# Patient Record
Sex: Female | Born: 1960 | Race: White | Hispanic: No | Marital: Married | Smoking: Former smoker
Health system: Southern US, Community
[De-identification: ages and names within clinical notes are randomized; demographics above are authoritative.]

## PROBLEM LIST (undated history)

## (undated) DIAGNOSIS — Z972 Presence of dental prosthetic device (complete) (partial): Secondary | ICD-10-CM

## (undated) DIAGNOSIS — J189 Pneumonia, unspecified organism: Secondary | ICD-10-CM

## (undated) DIAGNOSIS — Z973 Presence of spectacles and contact lenses: Secondary | ICD-10-CM

## (undated) DIAGNOSIS — R413 Other amnesia: Secondary | ICD-10-CM

## (undated) DIAGNOSIS — G43909 Migraine, unspecified, not intractable, without status migrainosus: Secondary | ICD-10-CM

## (undated) DIAGNOSIS — M1990 Unspecified osteoarthritis, unspecified site: Secondary | ICD-10-CM

## (undated) DIAGNOSIS — I609 Nontraumatic subarachnoid hemorrhage, unspecified: Secondary | ICD-10-CM

## (undated) DIAGNOSIS — F32A Depression, unspecified: Secondary | ICD-10-CM

## (undated) DIAGNOSIS — E669 Obesity, unspecified: Secondary | ICD-10-CM

## (undated) DIAGNOSIS — I10 Essential (primary) hypertension: Secondary | ICD-10-CM

## (undated) DIAGNOSIS — F329 Major depressive disorder, single episode, unspecified: Secondary | ICD-10-CM

## (undated) DIAGNOSIS — F419 Anxiety disorder, unspecified: Secondary | ICD-10-CM

## (undated) DIAGNOSIS — D649 Anemia, unspecified: Secondary | ICD-10-CM

## (undated) DIAGNOSIS — E559 Vitamin D deficiency, unspecified: Secondary | ICD-10-CM

## (undated) DIAGNOSIS — R42 Dizziness and giddiness: Secondary | ICD-10-CM

## (undated) DIAGNOSIS — E1169 Type 2 diabetes mellitus with other specified complication: Secondary | ICD-10-CM

## (undated) HISTORY — PX: HERNIA REPAIR: SHX51

## (undated) HISTORY — PX: BREAST BIOPSY: SHX20

## (undated) HISTORY — DX: Type 2 diabetes mellitus with other specified complication: E66.9

## (undated) HISTORY — DX: Nontraumatic subarachnoid hemorrhage, unspecified: I60.9

## (undated) HISTORY — DX: Type 2 diabetes mellitus with other specified complication: E11.69

## (undated) HISTORY — PX: DILATION AND CURETTAGE OF UTERUS: SHX78

## (undated) HISTORY — PX: COLONOSCOPY: SHX174

## (undated) HISTORY — DX: Depression, unspecified: F32.A

## (undated) HISTORY — PX: FRACTURE SURGERY: SHX138

## (undated) HISTORY — PX: TONSILLECTOMY: SUR1361

## (undated) HISTORY — DX: Major depressive disorder, single episode, unspecified: F32.9

---

## 1997-06-15 HISTORY — PX: ORIF TIBIA & FIBULA FRACTURES: SHX2131

## 1997-11-04 ENCOUNTER — Emergency Department (HOSPITAL_COMMUNITY): Admission: EM | Admit: 1997-11-04 | Discharge: 1997-11-04 | Payer: Self-pay | Admitting: Emergency Medicine

## 1998-04-02 ENCOUNTER — Inpatient Hospital Stay (HOSPITAL_COMMUNITY): Admission: RE | Admit: 1998-04-02 | Discharge: 1998-04-05 | Payer: Self-pay | Admitting: Orthopedic Surgery

## 1999-04-04 ENCOUNTER — Ambulatory Visit (HOSPITAL_COMMUNITY): Admission: RE | Admit: 1999-04-04 | Discharge: 1999-04-04 | Payer: Self-pay | Admitting: Orthopedic Surgery

## 2005-10-14 ENCOUNTER — Encounter: Payer: Self-pay | Admitting: Orthopedic Surgery

## 2007-06-27 ENCOUNTER — Emergency Department: Payer: Self-pay | Admitting: Emergency Medicine

## 2007-06-27 ENCOUNTER — Other Ambulatory Visit: Payer: Self-pay

## 2007-08-11 ENCOUNTER — Emergency Department: Payer: Self-pay | Admitting: Emergency Medicine

## 2008-07-04 ENCOUNTER — Emergency Department: Payer: Self-pay | Admitting: Emergency Medicine

## 2011-01-19 ENCOUNTER — Other Ambulatory Visit (HOSPITAL_COMMUNITY): Payer: Self-pay | Admitting: Obstetrics

## 2011-01-19 DIAGNOSIS — R58 Hemorrhage, not elsewhere classified: Secondary | ICD-10-CM

## 2011-01-19 DIAGNOSIS — Z1231 Encounter for screening mammogram for malignant neoplasm of breast: Secondary | ICD-10-CM

## 2011-01-27 ENCOUNTER — Other Ambulatory Visit (HOSPITAL_COMMUNITY): Payer: Self-pay | Admitting: Obstetrics

## 2011-01-27 ENCOUNTER — Ambulatory Visit (HOSPITAL_COMMUNITY): Payer: Medicaid Other

## 2011-01-27 ENCOUNTER — Ambulatory Visit (HOSPITAL_COMMUNITY): Payer: Self-pay

## 2011-01-27 DIAGNOSIS — R58 Hemorrhage, not elsewhere classified: Secondary | ICD-10-CM

## 2011-02-04 ENCOUNTER — Ambulatory Visit (HOSPITAL_COMMUNITY)
Admission: RE | Admit: 2011-02-04 | Discharge: 2011-02-04 | Disposition: A | Discharge status: Home / Self Care | Payer: Medicare Other | Source: Ambulatory Visit | Attending: Obstetrics | Admitting: Obstetrics

## 2011-02-04 DIAGNOSIS — R58 Hemorrhage, not elsewhere classified: Secondary | ICD-10-CM

## 2011-02-04 DIAGNOSIS — Z1231 Encounter for screening mammogram for malignant neoplasm of breast: Secondary | ICD-10-CM

## 2011-02-04 DIAGNOSIS — N920 Excessive and frequent menstruation with regular cycle: Secondary | ICD-10-CM | POA: Insufficient documentation

## 2011-02-06 ENCOUNTER — Other Ambulatory Visit: Payer: Self-pay | Admitting: Obstetrics

## 2011-02-06 DIAGNOSIS — R928 Other abnormal and inconclusive findings on diagnostic imaging of breast: Secondary | ICD-10-CM

## 2011-03-16 ENCOUNTER — Ambulatory Visit
Admission: RE | Admit: 2011-03-16 | Discharge: 2011-03-16 | Disposition: A | Discharge status: Home / Self Care | Payer: Medicaid Other | Source: Ambulatory Visit | Attending: Obstetrics | Admitting: Obstetrics

## 2011-03-16 ENCOUNTER — Ambulatory Visit
Admission: RE | Admit: 2011-03-16 | Discharge: 2011-03-16 | Disposition: A | Discharge status: Home / Self Care | Payer: Medicare Other | Source: Ambulatory Visit | Attending: Obstetrics | Admitting: Obstetrics

## 2011-03-16 DIAGNOSIS — R928 Other abnormal and inconclusive findings on diagnostic imaging of breast: Secondary | ICD-10-CM

## 2011-08-04 DIAGNOSIS — I10 Essential (primary) hypertension: Secondary | ICD-10-CM | POA: Diagnosis not present

## 2011-08-04 DIAGNOSIS — E669 Obesity, unspecified: Secondary | ICD-10-CM | POA: Diagnosis not present

## 2011-12-02 DIAGNOSIS — I10 Essential (primary) hypertension: Secondary | ICD-10-CM | POA: Diagnosis not present

## 2011-12-02 DIAGNOSIS — T148 Other injury of unspecified body region: Secondary | ICD-10-CM | POA: Diagnosis not present

## 2011-12-02 DIAGNOSIS — W57XXXA Bitten or stung by nonvenomous insect and other nonvenomous arthropods, initial encounter: Secondary | ICD-10-CM | POA: Diagnosis not present

## 2012-03-01 DIAGNOSIS — I10 Essential (primary) hypertension: Secondary | ICD-10-CM | POA: Diagnosis not present

## 2012-03-01 DIAGNOSIS — Z1239 Encounter for other screening for malignant neoplasm of breast: Secondary | ICD-10-CM | POA: Diagnosis not present

## 2012-03-01 DIAGNOSIS — G9050 Complex regional pain syndrome I, unspecified: Secondary | ICD-10-CM | POA: Diagnosis not present

## 2012-06-27 DIAGNOSIS — M1710 Unilateral primary osteoarthritis, unspecified knee: Secondary | ICD-10-CM | POA: Diagnosis not present

## 2012-06-27 DIAGNOSIS — M19079 Primary osteoarthritis, unspecified ankle and foot: Secondary | ICD-10-CM | POA: Diagnosis not present

## 2012-06-28 DIAGNOSIS — I10 Essential (primary) hypertension: Secondary | ICD-10-CM | POA: Diagnosis not present

## 2012-06-28 DIAGNOSIS — J069 Acute upper respiratory infection, unspecified: Secondary | ICD-10-CM | POA: Diagnosis not present

## 2012-06-28 DIAGNOSIS — Z13220 Encounter for screening for lipoid disorders: Secondary | ICD-10-CM | POA: Diagnosis not present

## 2012-06-28 DIAGNOSIS — Z6841 Body Mass Index (BMI) 40.0 and over, adult: Secondary | ICD-10-CM | POA: Diagnosis not present

## 2012-06-28 DIAGNOSIS — Z131 Encounter for screening for diabetes mellitus: Secondary | ICD-10-CM | POA: Diagnosis not present

## 2012-06-30 DIAGNOSIS — R05 Cough: Secondary | ICD-10-CM | POA: Diagnosis not present

## 2012-06-30 DIAGNOSIS — J069 Acute upper respiratory infection, unspecified: Secondary | ICD-10-CM | POA: Diagnosis not present

## 2012-06-30 DIAGNOSIS — R059 Cough, unspecified: Secondary | ICD-10-CM | POA: Diagnosis not present

## 2012-06-30 DIAGNOSIS — J209 Acute bronchitis, unspecified: Secondary | ICD-10-CM | POA: Diagnosis not present

## 2012-09-24 IMAGING — US US TRANSVAGINAL NON-OB
1 series · 14 of 25 positions shown · non-contrast
Comparison: None.

CLINICAL DATA: Perimenopausal.  Menorrhagia.

TRANSABDOMINAL AND TRANSVAGINAL ULTRASOUND OF PELVIS
TECHNIQUE: Both transabdominal and transvaginal ultrasound
examinations of the pelvis were performed. Transabdominal technique
was performed for global imaging of the pelvis including uterus,
ovaries, adnexal regions, and pelvic cul-de-sac.

[Series 1: us pelvis complete · 14 of 62 slices shown]
[im 1/62]
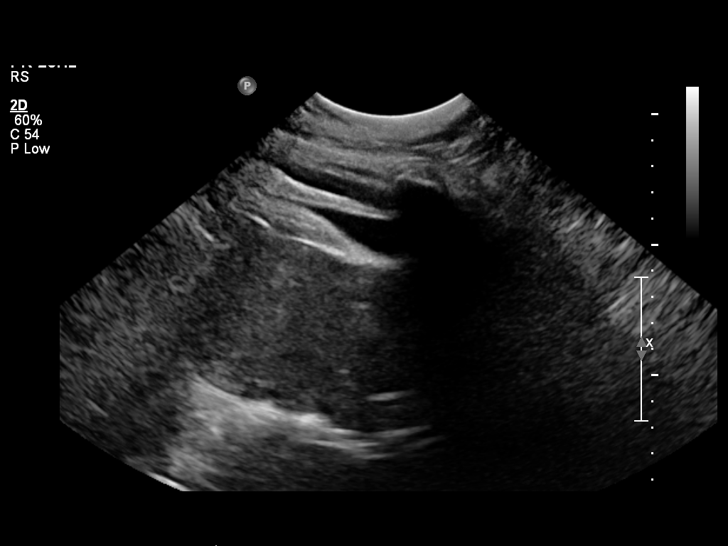
[im 6/62]
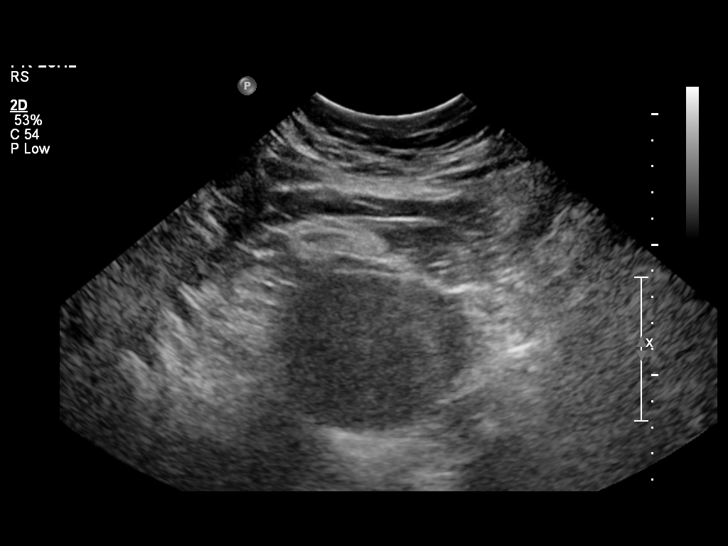
[im 11/62]
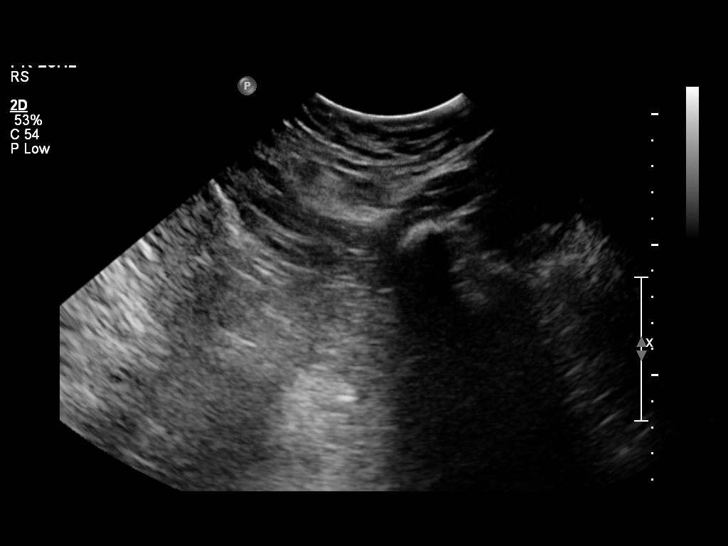
[im 16/62]
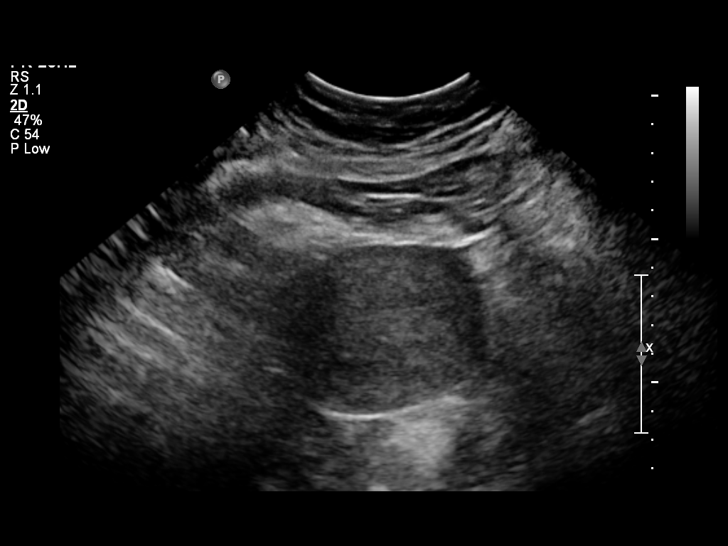
[im 21/62]
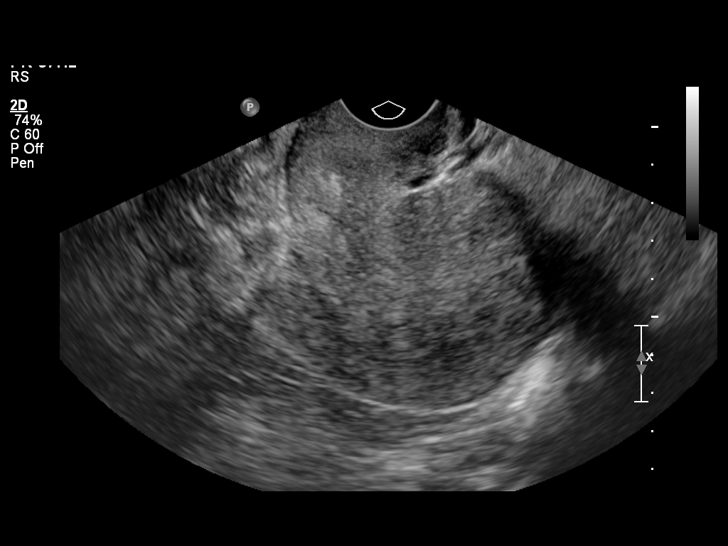
[im 23/62]
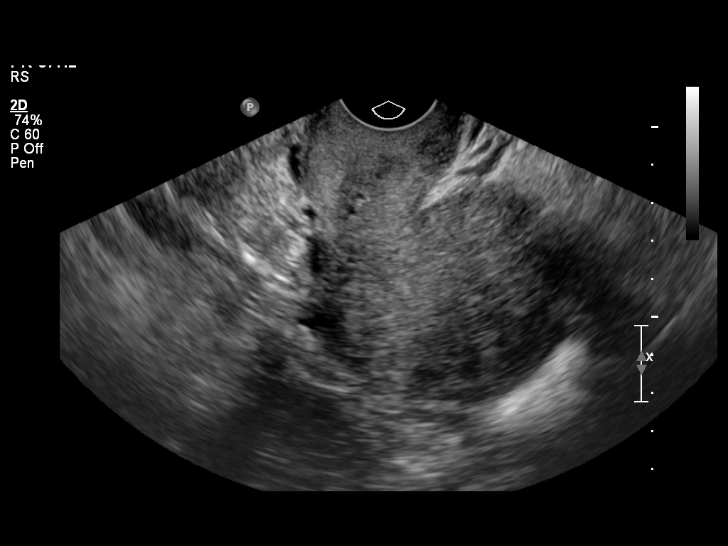
[im 28/62]
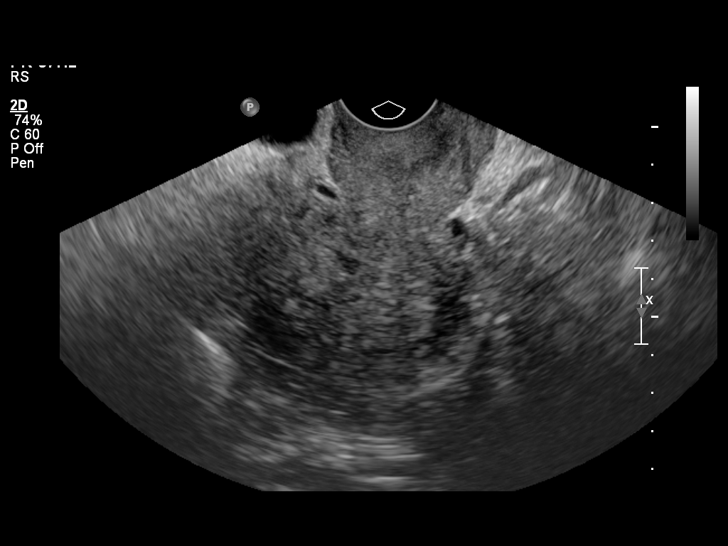
[im 34/62]
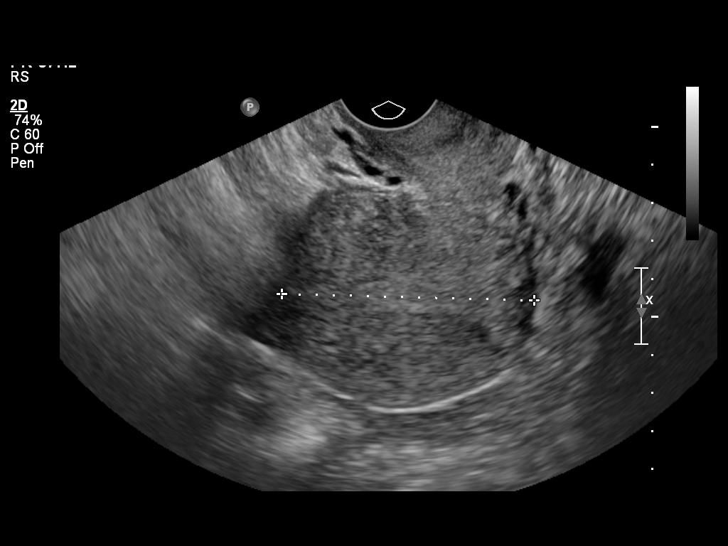
[im 39/62]
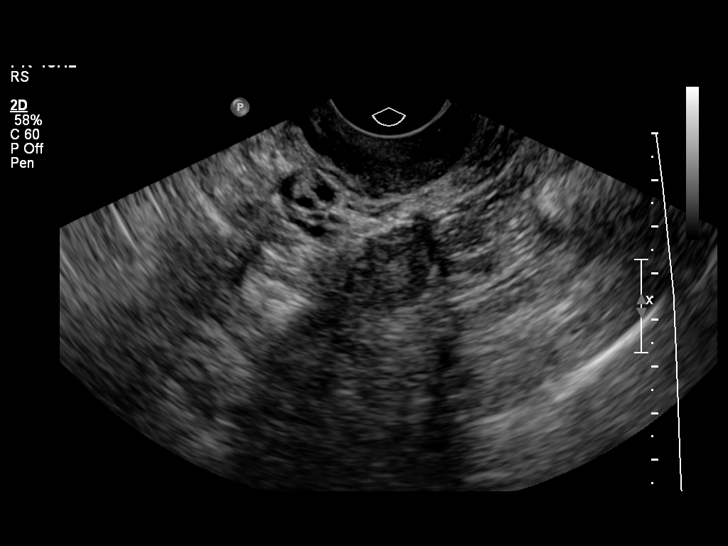
[im 41/62]
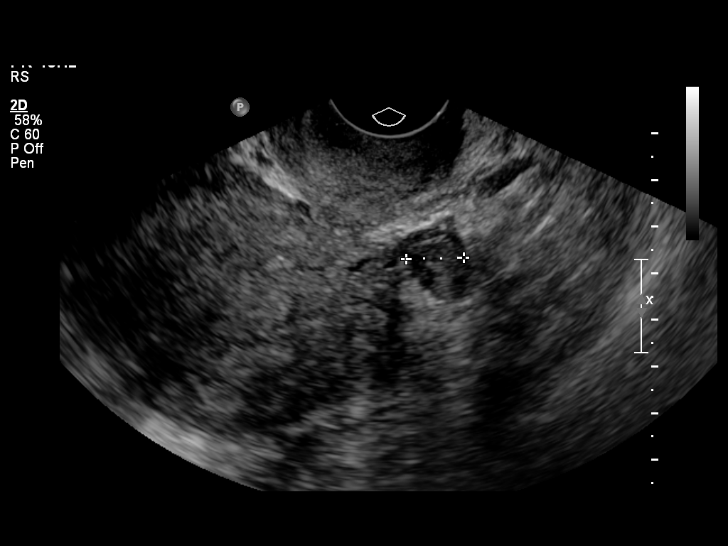
[im 46/62]
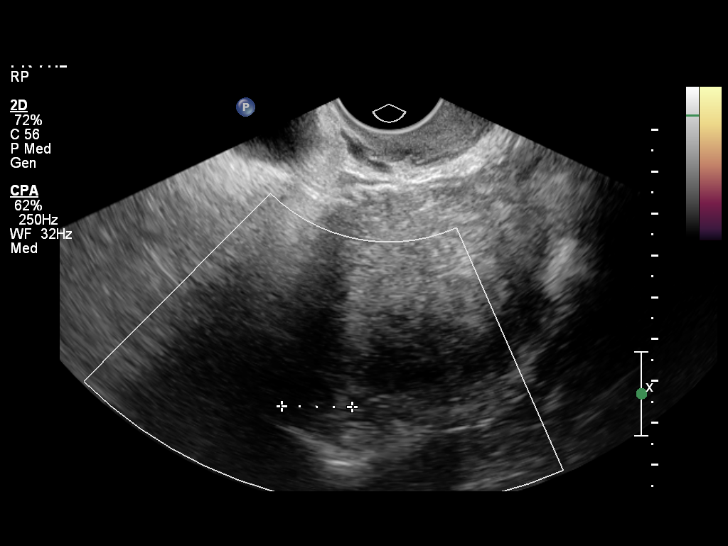
[im 51/62]
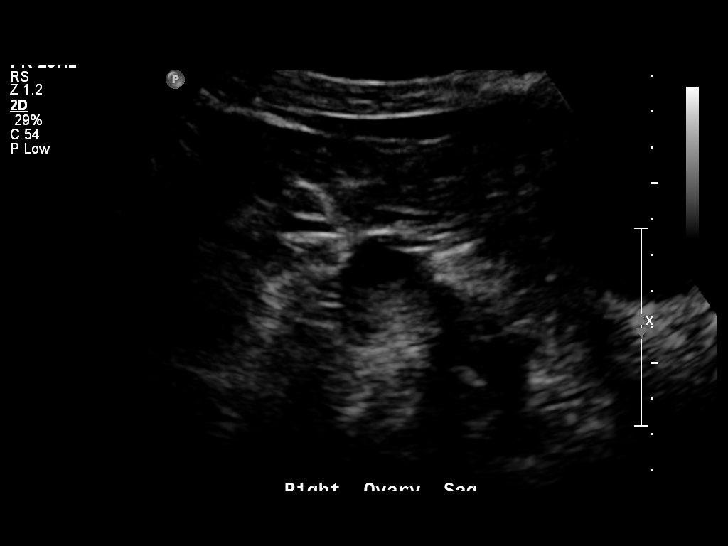
[im 56/62]
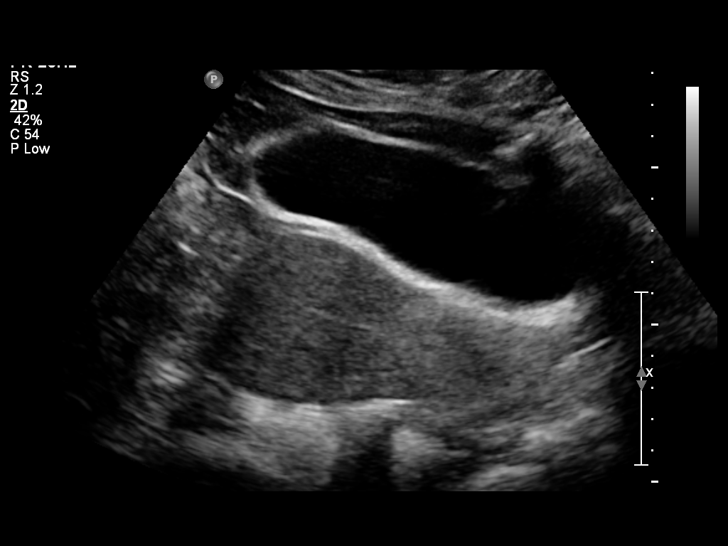
[im 62/62]
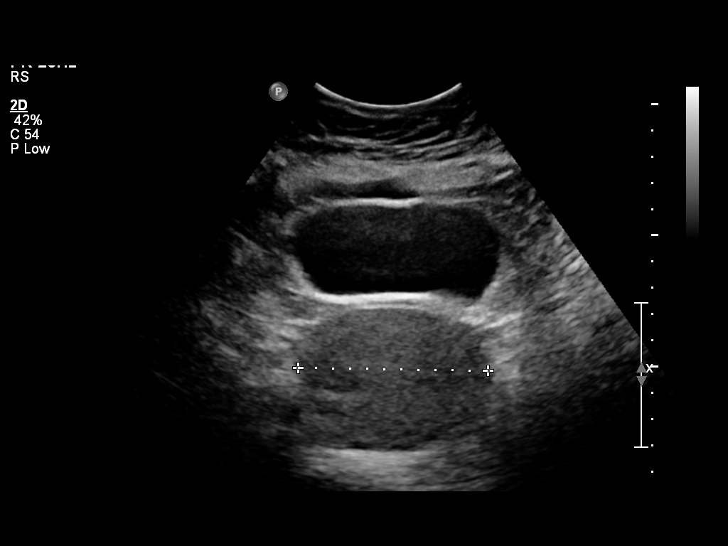

[14 of 25 positions shown; findings below may reference images not displayed]

It was necessary to proceed with endovaginal exam following the
transabdominal exam to visualize the ovaries, neither seen
transabdominally, and endometrium.
FINDINGS: Uterus: 10.8 x 7.7 x 5.4 cm.  Retroverted, retroflexed.  No focal
abnormality.

Endometrium: Not well visualized, 4 mm, thin and echogenic where
visualized.

Right ovary:  2.8 x 2.1 x 2.0 cm.  Normal appearance, when
transabdominal imaging was repeated after the initial
transabdominal /transvaginalstudy.

Left ovary: 2.4 x 1.5 x 1.2 cm.  Normal.

Other findings: No free fluid
IMPRESSION: No focal abnormality.  Endometrial atrophy measuring 4 mm, which
may present with vaginal bleeding, although not typically
menorrhagia.

## 2012-11-21 DIAGNOSIS — S90569A Insect bite (nonvenomous), unspecified ankle, initial encounter: Secondary | ICD-10-CM | POA: Diagnosis not present

## 2012-11-21 DIAGNOSIS — L02419 Cutaneous abscess of limb, unspecified: Secondary | ICD-10-CM | POA: Diagnosis not present

## 2012-12-27 DIAGNOSIS — Z1239 Encounter for other screening for malignant neoplasm of breast: Secondary | ICD-10-CM | POA: Diagnosis not present

## 2012-12-27 DIAGNOSIS — E559 Vitamin D deficiency, unspecified: Secondary | ICD-10-CM | POA: Diagnosis not present

## 2012-12-27 DIAGNOSIS — Z131 Encounter for screening for diabetes mellitus: Secondary | ICD-10-CM | POA: Diagnosis not present

## 2012-12-27 DIAGNOSIS — I10 Essential (primary) hypertension: Secondary | ICD-10-CM | POA: Diagnosis not present

## 2013-02-22 ENCOUNTER — Other Ambulatory Visit: Payer: Self-pay

## 2013-02-22 DIAGNOSIS — Z1231 Encounter for screening mammogram for malignant neoplasm of breast: Secondary | ICD-10-CM

## 2013-03-17 ENCOUNTER — Ambulatory Visit
Admission: RE | Admit: 2013-03-17 | Discharge: 2013-03-17 | Disposition: A | Discharge status: Home / Self Care | Payer: Medicare Other | Source: Ambulatory Visit

## 2013-03-17 DIAGNOSIS — Z1231 Encounter for screening mammogram for malignant neoplasm of breast: Secondary | ICD-10-CM

## 2013-03-27 ENCOUNTER — Other Ambulatory Visit: Payer: Self-pay | Admitting: Internal Medicine

## 2013-03-27 DIAGNOSIS — R928 Other abnormal and inconclusive findings on diagnostic imaging of breast: Secondary | ICD-10-CM

## 2013-04-07 ENCOUNTER — Other Ambulatory Visit: Payer: Self-pay | Admitting: Internal Medicine

## 2013-04-07 ENCOUNTER — Ambulatory Visit
Admission: RE | Admit: 2013-04-07 | Discharge: 2013-04-07 | Disposition: A | Discharge status: Home / Self Care | Payer: BC Managed Care – PPO | Source: Ambulatory Visit | Attending: Internal Medicine | Admitting: Internal Medicine

## 2013-04-07 ENCOUNTER — Ambulatory Visit
Admission: RE | Admit: 2013-04-07 | Discharge: 2013-04-07 | Disposition: A | Discharge status: Home / Self Care | Payer: Medicare Other | Source: Ambulatory Visit | Attending: Internal Medicine | Admitting: Internal Medicine

## 2013-04-07 DIAGNOSIS — R928 Other abnormal and inconclusive findings on diagnostic imaging of breast: Secondary | ICD-10-CM

## 2013-04-07 DIAGNOSIS — N6019 Diffuse cystic mastopathy of unspecified breast: Secondary | ICD-10-CM | POA: Diagnosis not present

## 2013-04-07 DIAGNOSIS — N6320 Unspecified lump in the left breast, unspecified quadrant: Secondary | ICD-10-CM

## 2013-04-07 DIAGNOSIS — N6489 Other specified disorders of breast: Secondary | ICD-10-CM | POA: Diagnosis not present

## 2013-06-27 DIAGNOSIS — E785 Hyperlipidemia, unspecified: Secondary | ICD-10-CM | POA: Diagnosis not present

## 2013-06-27 DIAGNOSIS — E669 Obesity, unspecified: Secondary | ICD-10-CM | POA: Diagnosis not present

## 2013-06-27 DIAGNOSIS — Z6841 Body Mass Index (BMI) 40.0 and over, adult: Secondary | ICD-10-CM | POA: Diagnosis not present

## 2013-06-27 DIAGNOSIS — Z23 Encounter for immunization: Secondary | ICD-10-CM | POA: Diagnosis not present

## 2013-06-27 DIAGNOSIS — E559 Vitamin D deficiency, unspecified: Secondary | ICD-10-CM | POA: Diagnosis not present

## 2013-06-27 DIAGNOSIS — I10 Essential (primary) hypertension: Secondary | ICD-10-CM | POA: Diagnosis not present

## 2013-06-27 DIAGNOSIS — R7309 Other abnormal glucose: Secondary | ICD-10-CM | POA: Diagnosis not present

## 2014-04-02 ENCOUNTER — Other Ambulatory Visit: Payer: Self-pay

## 2014-04-02 DIAGNOSIS — Z1231 Encounter for screening mammogram for malignant neoplasm of breast: Secondary | ICD-10-CM

## 2014-04-02 DIAGNOSIS — Z1239 Encounter for other screening for malignant neoplasm of breast: Secondary | ICD-10-CM

## 2014-04-18 ENCOUNTER — Ambulatory Visit
Admission: RE | Admit: 2014-04-18 | Discharge: 2014-04-18 | Disposition: A | Discharge status: Home / Self Care | Payer: Medicare Other | Source: Ambulatory Visit

## 2014-04-18 DIAGNOSIS — Z1231 Encounter for screening mammogram for malignant neoplasm of breast: Secondary | ICD-10-CM | POA: Diagnosis not present

## 2014-06-26 DIAGNOSIS — M129 Arthropathy, unspecified: Secondary | ICD-10-CM | POA: Diagnosis not present

## 2014-06-26 DIAGNOSIS — Z13220 Encounter for screening for lipoid disorders: Secondary | ICD-10-CM | POA: Diagnosis not present

## 2014-06-26 DIAGNOSIS — I10 Essential (primary) hypertension: Secondary | ICD-10-CM | POA: Diagnosis not present

## 2014-07-03 DIAGNOSIS — R221 Localized swelling, mass and lump, neck: Secondary | ICD-10-CM | POA: Diagnosis not present

## 2014-07-20 ENCOUNTER — Other Ambulatory Visit (INDEPENDENT_AMBULATORY_CARE_PROVIDER_SITE_OTHER): Payer: Self-pay | Admitting: Surgery

## 2014-07-20 DIAGNOSIS — R1900 Intra-abdominal and pelvic swelling, mass and lump, unspecified site: Secondary | ICD-10-CM | POA: Diagnosis not present

## 2014-07-30 ENCOUNTER — Encounter (HOSPITAL_BASED_OUTPATIENT_CLINIC_OR_DEPARTMENT_OTHER): Payer: Self-pay | Admitting: *Deleted

## 2014-07-30 NOTE — H&P (Signed)
Kristine Blackburn 07/20/2014 10:34 AM Location: [facility] Patient #: [id number] DOB: July 19, 1960 Married / Language: English / Race: Black or African American Female  History of Present Illness (Arista Kettlewell A. Ninfa Linden MD; 07/20/2014 11:00 AM) Patient words: New Patient.  The patient is a 54 year old female who presents with a complaint of Mass. She is referred to me by Dr. Lorra Hals for evaluation of a left flank mass. The patient reports that the mass has been present for at least 2 years. It is slowly increasing in size and causing increasing discomfort. She denies trauma to the area. She recently had an ultrasound suggesting this is a large lymph node. She denies night sweats. She is otherwise without complaints   Other Problems Sharman Crate, LPN; 0/06/7492 49:67 AM) Arthritis Depression High blood pressure  Past Surgical History Sharman Crate, LPN; 10/21/1636 46:65 AM) Breast Biopsy Left. Colon Polyp Removal - Colonoscopy Foot Surgery Left. Knee Surgery Left.  Diagnostic Studies History Sharman Crate, LPN; 02/21/3569 17:79 AM) Mammogram within last year  Allergies Sharman Crate, LPN; 08/21/298 92:33 AM) No Known Drug Allergies02/10/2014  Medication History Sharman Crate, LPN; 0/0/7622 63:33 AM) Lisinopril-Hydrochlorothiazide (20-12.5MG  Tablet, Oral) Active. Vitamin D (Ergocalciferol) (50000UNIT Capsule, Oral) Active.  Social History Sharman Crate, LPN; 10/17/5623 63:89 AM) Alcohol use Occasional alcohol use. Caffeine use Carbonated beverages. Illicit drug use Uses socially only. Tobacco use Former smoker.  Family History Sharman Crate, LPN; 08/20/3426 76:81 AM) Anesthetic complications Mother. Cervical Cancer Mother. Heart Disease Father. Heart disease in female family member before age 46 Heart disease in female family member before age 46 Hypertension Father, Mother. Melanoma Father, Mother. Migraine Headache Sister. Ovarian Cancer  Mother.  Pregnancy / Birth History Sharman Crate, LPN; 06/19/7260 03:55 AM) Age at menarche 27 years. Contraceptive History Oral contraceptives. Gravida 1 Irregular periods Maternal age 84-25 Para 0  Review of Systems Clarise Cruz [facility] LPN; 02/19/4162 84:53 AM) General Not Present- Appetite Loss, Chills, Fatigue, Fever, Night Sweats, Weight Gain and Weight Loss. Skin Not Present- Change in Wart/Mole, Dryness, Hives, Jaundice, New Lesions, Non-Healing Wounds, Rash and Ulcer. HEENT Present- Seasonal Allergies and Wears glasses/contact lenses. Not Present- Earache, Hearing Loss, Hoarseness, Nose Bleed, Oral Ulcers, Ringing in the Ears, Sinus Pain, Sore Throat, Visual Disturbances and Yellow Eyes. Respiratory Present- Snoring. Not Present- Bloody sputum, Chronic Cough, Difficulty Breathing and Wheezing. Breast Not Present- Breast Mass, Breast Pain, Nipple Discharge and Skin Changes. Cardiovascular Not Present- Chest Pain, Difficulty Breathing Lying Down, Leg Cramps, Palpitations, Rapid Heart Rate, Shortness of Breath and Swelling of Extremities. Gastrointestinal Not Present- Abdominal Pain, Bloating, Bloody Stool, Change in Bowel Habits, Chronic diarrhea, Constipation, Difficulty Swallowing, Excessive gas, Gets full quickly at meals, Hemorrhoids, Indigestion, Nausea, Rectal Pain and Vomiting. Female Genitourinary Not Present- Frequency, Nocturia, Painful Urination, Pelvic Pain and Urgency. Musculoskeletal Not Present- Back Pain, Joint Pain, Joint Stiffness, Muscle Pain, Muscle Weakness and Swelling of Extremities. Neurological Present- Weakness. Not Present- Decreased Memory, Fainting, Headaches, Numbness, Seizures, Tingling, Tremor and Trouble walking. Psychiatric Present- Change in Sleep Pattern and Depression. Not Present- Anxiety, Bipolar, Fearful and Frequent crying. Endocrine Not Present- Cold Intolerance, Excessive Hunger, Hair Changes, Heat Intolerance, Hot flashes and New  Diabetes. Hematology Not Present- Easy Bruising, Excessive bleeding, Gland problems, HIV and Persistent Infections.   Vitals Clarise Cruz [facility] LPN; 11/16/6801 21:22 AM) 07/20/2014 10:34 AM Weight: 271 lb Height: 63in Body Surface Area: 2.34 m Body Mass Index: 48.01 kg/m Temp.: 98.13F(Oral)  Pulse: 80 (Regular)  BP: 138/82 (Sitting, Right Arm, Standard)  Physical Exam (Lorne Winkels A. Ninfa Linden MD; 07/20/2014 11:01 AM) General Mental Status-Alert. General Appearance-Consistent with stated age. Hydration-Well hydrated. Voice-Normal.  Head and Neck Head-normocephalic, atraumatic with no lesions or palpable masses. Trachea-midline. Thyroid Gland Characteristics - normal size and consistency.  Eye Eyeball - Bilateral-Extraocular movements intact. Sclera/Conjunctiva - Bilateral-No scleral icterus.  Chest and Lung Exam Chest and lung exam reveals -quiet, even and easy respiratory effort with no use of accessory muscles and on auscultation, normal breath sounds, no adventitious sounds and normal vocal resonance. Inspection Chest Wall - Normal. Back - normal.  Breast Breast - Left-Symmetric, Non Tender, No Biopsy scars, no Dimpling, No Inflammation, No Lumpectomy scars, No Mastectomy scars, No Peau d' Orange. Breast - Right-Symmetric, Non Tender, No Biopsy scars, no Dimpling, No Inflammation, No Lumpectomy scars, No Mastectomy scars, No Peau d' Orange. Breast Lump-No Palpable Breast Mass.  Cardiovascular Cardiovascular examination reveals -normal heart sounds, regular rate and rhythm with no murmurs and normal pedal pulses bilaterally.  Abdomen Inspection Inspection of the abdomen reveals - No Hernias. Skin - Scar - no surgical scars. Palpation/Percussion Palpation and Percussion of the abdomen reveal - Soft, Non Tender, No Rebound tenderness, No Rigidity (guarding) and No hepatosplenomegaly. Auscultation Auscultation of the abdomen reveals - Bowel  sounds normal. Note: On the left flank, there is a 2-2-1/2 cm soft mass which is mobile and moderately deep but in the subcutaneous space tissue. There are no skin changes.   Neurologic Neurologic evaluation reveals -alert and oriented x 3 with no impairment of recent or remote memory. Mental Status-Normal.  Musculoskeletal Normal Exam - Left-Upper Extremity Strength Normal and Lower Extremity Strength Normal. Normal Exam - Right-Upper Extremity Strength Normal and Lower Extremity Strength Normal.  Lymphatic Head & Neck  General Head & Neck Lymphatics: Bilateral - Description - Normal. Axillary  General Axillary Region: Bilateral - Description - Normal. Tenderness - Non Tender. Femoral & Inguinal  Generalized Femoral & Inguinal Lymphatics: Bilateral - Description - Normal. Tenderness - Non Tender.    Assessment & Plan (Julianah Marciel A. 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 Nonetheless, it is enlarging and causing her discomfort so removal is recommended for histologic evaluation to rule out malignancy. I discussed this with her in detail. I discussed the risks of bleeding, infection, and recurrence. She understands and wishes to proceed with surgery

## 2014-07-30 NOTE — Progress Notes (Signed)
Pt states labs and ekg dr Lucile Shutters 3 weeks ago called and said needed these today

## 2014-07-31 ENCOUNTER — Encounter (HOSPITAL_BASED_OUTPATIENT_CLINIC_OR_DEPARTMENT_OTHER): Payer: Self-pay | Admitting: *Deleted

## 2014-07-31 ENCOUNTER — Ambulatory Visit (HOSPITAL_BASED_OUTPATIENT_CLINIC_OR_DEPARTMENT_OTHER): Payer: BLUE CROSS/BLUE SHIELD | Admitting: Anesthesiology

## 2014-07-31 ENCOUNTER — Encounter (HOSPITAL_BASED_OUTPATIENT_CLINIC_OR_DEPARTMENT_OTHER)
Admission: RE | Disposition: A | Discharge status: Home / Self Care | Payer: Self-pay | Source: Ambulatory Visit | Attending: Surgery

## 2014-07-31 ENCOUNTER — Ambulatory Visit (HOSPITAL_BASED_OUTPATIENT_CLINIC_OR_DEPARTMENT_OTHER)
Admission: RE | Admit: 2014-07-31 | Discharge: 2014-07-31 | Disposition: A | Discharge status: Home / Self Care | Payer: BLUE CROSS/BLUE SHIELD | Source: Ambulatory Visit | Attending: Surgery | Admitting: Surgery

## 2014-07-31 DIAGNOSIS — M1990 Unspecified osteoarthritis, unspecified site: Secondary | ICD-10-CM | POA: Insufficient documentation

## 2014-07-31 DIAGNOSIS — R222 Localized swelling, mass and lump, trunk: Secondary | ICD-10-CM | POA: Diagnosis present

## 2014-07-31 DIAGNOSIS — Z87891 Personal history of nicotine dependence: Secondary | ICD-10-CM | POA: Insufficient documentation

## 2014-07-31 DIAGNOSIS — F1910 Other psychoactive substance abuse, uncomplicated: Secondary | ICD-10-CM | POA: Diagnosis not present

## 2014-07-31 DIAGNOSIS — F329 Major depressive disorder, single episode, unspecified: Secondary | ICD-10-CM | POA: Insufficient documentation

## 2014-07-31 DIAGNOSIS — Z86010 Personal history of colonic polyps: Secondary | ICD-10-CM | POA: Insufficient documentation

## 2014-07-31 DIAGNOSIS — D1779 Benign lipomatous neoplasm of other sites: Secondary | ICD-10-CM | POA: Diagnosis not present

## 2014-07-31 DIAGNOSIS — D171 Benign lipomatous neoplasm of skin and subcutaneous tissue of trunk: Secondary | ICD-10-CM | POA: Insufficient documentation

## 2014-07-31 DIAGNOSIS — I10 Essential (primary) hypertension: Secondary | ICD-10-CM | POA: Insufficient documentation

## 2014-07-31 HISTORY — DX: Unspecified osteoarthritis, unspecified site: M19.90

## 2014-07-31 HISTORY — DX: Essential (primary) hypertension: I10

## 2014-07-31 HISTORY — DX: Presence of spectacles and contact lenses: Z97.3

## 2014-07-31 HISTORY — PX: MASS EXCISION: SHX2000

## 2014-07-31 SURGERY — EXCISION MASS
Anesthesia: Monitor Anesthesia Care | Site: Flank | Laterality: Left

## 2014-07-31 MED ORDER — SODIUM CHLORIDE 0.9 % IV SOLN: 250.0000 mL | INTRAVENOUS | Status: DC | PRN

## 2014-07-31 MED ORDER — MIDAZOLAM HCL 2 MG/2ML IJ SOLN
INTRAMUSCULAR | Status: AC
  Filled 2014-07-31: qty 2

## 2014-07-31 MED ORDER — BUPIVACAINE-EPINEPHRINE 0.5% -1:200000 IJ SOLN
INTRAMUSCULAR | Status: DC | PRN
  Administered 2014-07-31: 17 mL

## 2014-07-31 MED ORDER — HYDROCODONE-ACETAMINOPHEN 5-325 MG PO TABS: 1.0000 | ORAL_TABLET | ORAL | Status: DC | PRN

## 2014-07-31 MED ORDER — ACETAMINOPHEN 650 MG RE SUPP: 650.0000 mg | RECTAL | Status: DC | PRN

## 2014-07-31 MED ORDER — ONDANSETRON HCL 4 MG/2ML IJ SOLN
INTRAMUSCULAR | Status: DC | PRN
  Administered 2014-07-31: 4 mg via INTRAVENOUS

## 2014-07-31 MED ORDER — PROPOFOL INFUSION 10 MG/ML OPTIME
INTRAVENOUS | Status: DC | PRN
  Administered 2014-07-31: 100 ug/kg/min via INTRAVENOUS

## 2014-07-31 MED ORDER — SODIUM CHLORIDE 0.9 % IJ SOLN: 3.0000 mL | Freq: Two times a day (BID) | INTRAMUSCULAR | Status: DC

## 2014-07-31 MED ORDER — LACTATED RINGERS IV SOLN
INTRAVENOUS | Status: DC
  Administered 2014-07-31: 09:00:00 via INTRAVENOUS

## 2014-07-31 MED ORDER — CEFAZOLIN SODIUM-DEXTROSE 2-3 GM-% IV SOLR
2.0000 g | INTRAVENOUS | Status: AC
  Administered 2014-07-31: 2 g via INTRAVENOUS

## 2014-07-31 MED ORDER — OXYCODONE HCL 5 MG/5ML PO SOLN: 5.0000 mg | Freq: Once | ORAL | Status: DC | PRN

## 2014-07-31 MED ORDER — OXYCODONE HCL 5 MG PO TABS: 5.0000 mg | ORAL_TABLET | Freq: Once | ORAL | Status: DC | PRN

## 2014-07-31 MED ORDER — LIDOCAINE HCL (CARDIAC) 20 MG/ML IV SOLN
INTRAVENOUS | Status: DC | PRN
  Administered 2014-07-31: 25 mg via INTRAVENOUS

## 2014-07-31 MED ORDER — FENTANYL CITRATE 0.05 MG/ML IJ SOLN
INTRAMUSCULAR | Status: AC
Start: 2014-07-31 — End: 2014-07-31
  Filled 2014-07-31: qty 4

## 2014-07-31 MED ORDER — OXYCODONE HCL 5 MG PO TABS
5.0000 mg | ORAL_TABLET | ORAL | Status: DC | PRN
Start: 2014-07-31 — End: 2014-07-31

## 2014-07-31 MED ORDER — LIDOCAINE HCL (PF) 1 % IJ SOLN
INTRAMUSCULAR | Status: DC | PRN
  Administered 2014-07-31: 10 mL

## 2014-07-31 MED ORDER — LIDOCAINE HCL (PF) 1 % IJ SOLN
INTRAMUSCULAR | Status: AC
  Filled 2014-07-31: qty 30

## 2014-07-31 MED ORDER — PROPOFOL 10 MG/ML IV EMUL
INTRAVENOUS | Status: AC
  Filled 2014-07-31: qty 50

## 2014-07-31 MED ORDER — MORPHINE SULFATE 2 MG/ML IJ SOLN: 1.0000 mg | INTRAMUSCULAR | Status: DC | PRN

## 2014-07-31 MED ORDER — SODIUM BICARBONATE 4 % IV SOLN
INTRAVENOUS | Status: AC
  Filled 2014-07-31: qty 5

## 2014-07-31 MED ORDER — ONDANSETRON HCL 4 MG/2ML IJ SOLN: 4.0000 mg | Freq: Once | INTRAMUSCULAR | Status: DC | PRN

## 2014-07-31 MED ORDER — HYDROMORPHONE HCL 1 MG/ML IJ SOLN: 0.2500 mg | INTRAMUSCULAR | Status: DC | PRN

## 2014-07-31 MED ORDER — MIDAZOLAM HCL 5 MG/5ML IJ SOLN
INTRAMUSCULAR | Status: DC | PRN
  Administered 2014-07-31 (×2): 1 mg via INTRAVENOUS

## 2014-07-31 MED ORDER — KETOROLAC TROMETHAMINE 30 MG/ML IJ SOLN
INTRAMUSCULAR | Status: DC | PRN
  Administered 2014-07-31: 30 mg via INTRAVENOUS

## 2014-07-31 MED ORDER — CEFAZOLIN SODIUM-DEXTROSE 2-3 GM-% IV SOLR
INTRAVENOUS | Status: AC
Start: 2014-07-31 — End: 2014-07-31
  Filled 2014-07-31: qty 50

## 2014-07-31 MED ORDER — FENTANYL CITRATE 0.05 MG/ML IJ SOLN
INTRAMUSCULAR | Status: DC | PRN
  Administered 2014-07-31 (×3): 50 ug via INTRAVENOUS

## 2014-07-31 MED ORDER — SODIUM CHLORIDE 0.9 % IJ SOLN: 3.0000 mL | INTRAMUSCULAR | Status: DC | PRN

## 2014-07-31 MED ORDER — FENTANYL CITRATE 0.05 MG/ML IJ SOLN: 50.0000 ug | INTRAMUSCULAR | Status: DC | PRN

## 2014-07-31 MED ORDER — ACETAMINOPHEN 325 MG PO TABS: 650.0000 mg | ORAL_TABLET | ORAL | Status: DC | PRN

## 2014-07-31 MED ORDER — BUPIVACAINE-EPINEPHRINE (PF) 0.5% -1:200000 IJ SOLN
INTRAMUSCULAR | Status: AC
  Filled 2014-07-31: qty 30

## 2014-07-31 MED ORDER — MIDAZOLAM HCL 2 MG/2ML IJ SOLN: 1.0000 mg | INTRAMUSCULAR | Status: DC | PRN

## 2014-07-31 SURGICAL SUPPLY — 43 items
BENZOIN TINCTURE PRP APPL 2/3 (GAUZE/BANDAGES/DRESSINGS) ×3 IMPLANT
BLADE CLIPPER SURG (BLADE) IMPLANT
BLADE HEX COATED 2.75 (ELECTRODE) ×3 IMPLANT
BLADE SURG 15 STRL LF DISP TIS (BLADE) ×1 IMPLANT
BLADE SURG 15 STRL SS (BLADE) ×2
CANISTER SUCT 1200ML W/VALVE (MISCELLANEOUS) IMPLANT
CHLORAPREP W/TINT 26ML (MISCELLANEOUS) ×3 IMPLANT
CLOSURE WOUND 1/2 X4 (GAUZE/BANDAGES/DRESSINGS) ×1
COVER BACK TABLE 60X90IN (DRAPES) ×3 IMPLANT
COVER MAYO STAND STRL (DRAPES) ×3 IMPLANT
DECANTER SPIKE VIAL GLASS SM (MISCELLANEOUS) IMPLANT
DRAPE LAPAROTOMY 100X72 PEDS (DRAPES) ×3 IMPLANT
DRAPE UTILITY XL STRL (DRAPES) ×3 IMPLANT
DRSG TEGADERM 4X4.75 (GAUZE/BANDAGES/DRESSINGS) ×3 IMPLANT
ELECT REM PT RETURN 9FT ADLT (ELECTROSURGICAL) ×3
ELECTRODE REM PT RTRN 9FT ADLT (ELECTROSURGICAL) ×1 IMPLANT
GLOVE SURG SIGNA 7.5 PF LTX (GLOVE) ×3 IMPLANT
GOWN STRL REUS W/ TWL LRG LVL3 (GOWN DISPOSABLE) ×1 IMPLANT
GOWN STRL REUS W/ TWL XL LVL3 (GOWN DISPOSABLE) ×1 IMPLANT
GOWN STRL REUS W/TWL LRG LVL3 (GOWN DISPOSABLE) ×2
GOWN STRL REUS W/TWL XL LVL3 (GOWN DISPOSABLE) ×2
NEEDLE HYPO 25X1 1.5 SAFETY (NEEDLE) ×3 IMPLANT
NS IRRIG 1000ML POUR BTL (IV SOLUTION) IMPLANT
PACK BASIN DAY SURGERY FS (CUSTOM PROCEDURE TRAY) ×3 IMPLANT
PENCIL BUTTON HOLSTER BLD 10FT (ELECTRODE) ×3 IMPLANT
SLEEVE SCD COMPRESS KNEE MED (MISCELLANEOUS) IMPLANT
SPONGE GAUZE 4X4 12PLY STER LF (GAUZE/BANDAGES/DRESSINGS) ×3 IMPLANT
SPONGE LAP 4X18 X RAY DECT (DISPOSABLE) ×3 IMPLANT
STRIP CLOSURE SKIN 1/2X4 (GAUZE/BANDAGES/DRESSINGS) ×2 IMPLANT
SUT MNCRL AB 4-0 PS2 18 (SUTURE) IMPLANT
SUT PROLENE 3 0 PS 2 (SUTURE) IMPLANT
SUT VIC AB 2-0 SH 27 (SUTURE)
SUT VIC AB 2-0 SH 27XBRD (SUTURE) IMPLANT
SUT VIC AB 3-0 SH 27 (SUTURE)
SUT VIC AB 3-0 SH 27X BRD (SUTURE) IMPLANT
SYR BULB 3OZ (MISCELLANEOUS) IMPLANT
SYR CONTROL 10ML LL (SYRINGE) ×3 IMPLANT
TOWEL OR 17X24 6PK STRL BLUE (TOWEL DISPOSABLE) ×3 IMPLANT
TOWEL OR NON WOVEN STRL DISP B (DISPOSABLE) ×3 IMPLANT
TRAY DSU PREP LF (CUSTOM PROCEDURE TRAY) IMPLANT
TUBE CONNECTING 20'X1/4 (TUBING)
TUBE CONNECTING 20X1/4 (TUBING) IMPLANT
YANKAUER SUCT BULB TIP NO VENT (SUCTIONS) IMPLANT

## 2014-07-31 NOTE — Discharge Instructions (Signed)
Ok to shower starting tomorrow  Call your surgeon if you experience:   1.  Fever over 101.0. 2.  Inability to urinate. 3.  Nausea and/or vomiting. 4.  Extreme swelling or bruising at the surgical site. 5.  Continued bleeding from the incision. 6.  Increased pain, redness or drainage from the incision. 7.  Problems related to your pain medication. 8. Any change in color, movement and/or sensation 9. Any problems and/or concerns   Post Anesthesia Home Care Instructions  Activity: Get plenty of rest for the remainder of the day. A responsible adult should stay with you for 24 hours following the procedure.  For the next 24 hours, DO NOT: -Drive a car -Paediatric nurse -Drink alcoholic beverages -Take any medication unless instructed by your physician -Make any legal decisions or sign important papers.  Meals: Start with liquid foods such as gelatin or soup. Progress to regular foods as tolerated. Avoid greasy, spicy, heavy foods. If nausea and/or vomiting occur, drink only clear liquids until the nausea and/or vomiting subsides. Call your physician if vomiting continues.  Special Instructions/Symptoms: Your throat may feel dry or sore from the anesthesia or the breathing tube placed in your throat during surgery. If this causes discomfort, gargle with warm salt water. The discomfort should disappear within 24 hours.

## 2014-07-31 NOTE — Anesthesia Procedure Notes (Signed)
Procedure Name: MAC Date/Time: 07/31/2014 10:18 AM Performed by: Baxter Flattery Pre-anesthesia Checklist: Patient identified, Emergency Drugs available, Suction available, Patient being monitored and Timeout performed Patient Re-evaluated:Patient Re-evaluated prior to inductionOxygen Delivery Method: Simple face mask Preoxygenation: Pre-oxygenation with 100% oxygen Intubation Type: IV induction Dental Injury: Teeth and Oropharynx as per pre-operative assessment

## 2014-07-31 NOTE — Transfer of Care (Signed)
Immediate Anesthesia Transfer of Care Note  Patient: Kristine Blackburn  Procedure(s) Performed: Procedure(s): EXCISION OF FLANK MASS (Left)  Patient Location: PACU  Anesthesia Type:MAC  Level of Consciousness: awake, alert  and oriented  Airway & Oxygen Therapy: Patient Spontanous Breathing and Patient connected to face mask oxygen  Post-op Assessment: Report given to RN, Post -op Vital signs reviewed and stable and Patient moving all extremities  Post vital signs: Reviewed and stable  Last Vitals:  Filed Vitals:   07/31/14 1049  BP:   Pulse: 78  Temp:   Resp: 16    Complications: No apparent anesthesia complications

## 2014-07-31 NOTE — Anesthesia Postprocedure Evaluation (Signed)
  Anesthesia Post-op Note  Patient: Kristine Blackburn  Procedure(s) Performed: Procedure(s): EXCISION OF FLANK MASS (Left)  Patient Location: PACU  Anesthesia Type: MAC   Level of Consciousness: awake, alert  and oriented  Airway and Oxygen Therapy: Patient Spontanous Breathing  Post-op Pain: none  Post-op Assessment: Post-op Vital signs reviewed  Post-op Vital Signs: Reviewed  Last Vitals:  Filed Vitals:   07/31/14 1115  BP: 140/77  Pulse: 70  Temp:   Resp: 16    Complications: No apparent anesthesia complications

## 2014-07-31 NOTE — Interval H&P Note (Signed)
History and Physical Interval Note: no change in H and P  07/31/2014 10:01 AM  Cyril Loosen  has presented today for surgery, with the diagnosis of Left Flank Mass  The various methods of treatment have been discussed with the patient and family. After consideration of risks, benefits and other options for treatment, the patient has consented to  Procedure(s): EXCISION OF FLANK MASS (N/A) as a surgical intervention .  The patient's history has been reviewed, patient examined, no change in status, stable for surgery.  I have reviewed the patient's chart and labs.  Questions were answered to the patient's satisfaction.     Aul Mangieri A

## 2014-07-31 NOTE — Op Note (Signed)
EXCISION OF [facility] MASS  Procedure Note  Kristine Blackburn 07/31/2014   Pre-op Diagnosis: Left Flank Mass     Post-op Diagnosis: same  Procedure(s): EXCISION OF FLANK MASS ( 3 cm)  Surgeon(s): Coralie Keens, MD  Anesthesia: Monitor Anesthesia Care  Staff:  Circulator: Eda Paschal, RN Scrub Person: Mickie Bail, CST  Estimated Blood Loss: Minimal               Specimens: sent to path          [facility] A   Date: 07/31/2014  Time: 10:44 AM

## 2014-07-31 NOTE — Anesthesia Preprocedure Evaluation (Signed)
Anesthesia Evaluation  Patient identified by MRN, date of birth, ID band Patient awake    Reviewed: Allergy & Precautions, NPO status , Patient's Chart, lab work & pertinent test results  Airway Mallampati: I  TM Distance: >3 FB Neck ROM: Full    Dental  (+) Teeth Intact, Dental Advisory Given   Pulmonary  breath sounds clear to auscultation        Cardiovascular hypertension, Pt. on medications Rhythm:Regular Rate:Normal     Neuro/Psych    GI/Hepatic   Endo/Other    Renal/GU      Musculoskeletal   Abdominal   Peds  Hematology   Anesthesia Other Findings   Reproductive/Obstetrics                             Anesthesia Physical Anesthesia Plan  ASA: II  Anesthesia Plan: MAC   Post-op Pain Management:    Induction: Intravenous  Airway Management Planned: Simple Face Mask  Additional Equipment:   Intra-op Plan:   Post-operative Plan:   Informed Consent: I have reviewed the patients History and Physical, chart, labs and discussed the procedure including the risks, benefits and alternatives for the proposed anesthesia with the patient or authorized representative who has indicated his/her understanding and acceptance.   Dental advisory given  Plan Discussed with: Anesthesiologist, Surgeon and CRNA  Anesthesia Plan Comments:         Anesthesia Quick Evaluation

## 2014-08-01 ENCOUNTER — Encounter (HOSPITAL_BASED_OUTPATIENT_CLINIC_OR_DEPARTMENT_OTHER): Payer: Self-pay | Admitting: Surgery

## 2014-08-01 NOTE — Op Note (Signed)
NAMEMarland Kitchen  CLYDIE, DILLEN              ACCOUNT NO.:  [id number]  MEDICAL RECORD NO.:  [id number]  LOCATION:                                 FACILITY:  PHYSICIAN:  Coralie Keens, M.D. DATE OF BIRTH:  June 25, 1960  DATE OF PROCEDURE:  07/31/2014 DATE OF DISCHARGE:  07/31/2014                              OPERATIVE REPORT   PREOPERATIVE DIAGNOSIS:  Left flank mass.  POSTOPERATIVE DIAGNOSIS:  Left flank mass.  PROCEDURE:  Excision of 3 cm left flank mass.  SURGEON:  Coralie Keens, M.D.  ANESTHESIA:  1% lidocaine, 0.5% Marcaine, and monitored anesthesia care.  ESTIMATED BLOOD LOSS:  Minimal.  FINDINGS:  The patient is found to have a 3 cm mass, which appeared consistent with a lipoma.  PROCEDURE IN DETAIL:  The patient was brought to the operating room, identified as Kristine Blackburn.  She was placed supine on the operating table, and anesthesia was induced.  Her left flank was then prepped and draped in the usual sterile fashion.  I anesthetized the skin over the top of the flank with lidocaine and then made incision with the scalpel, took this down to the flank mass.  I then excised the mass in its entirety.  It appeared consistent with a lipoma.  I probed deeply into the wound and found no other masses.  I then anesthetized the wound further with Marcaine as well as lidocaine.  Hemostasis appeared to be achieved with cautery.  I then closed the subcutaneous tissue with interrupted 3-0 Vicryl sutures and closed the skin with a running 4-0 Monocryl.  Skin glue was then applied.  The patient tolerated the procedure well.  All the counts were correct at the end of procedure. The patient was then taken in stable condition from the operating room to the recovery room.     Coralie Keens, M.D.     DB/MEDQ  D:  07/31/2014  T:  08/01/2014  Job:  [id number]

## 2015-02-27 DIAGNOSIS — M19072 Primary osteoarthritis, left ankle and foot: Secondary | ICD-10-CM | POA: Diagnosis not present

## 2015-02-27 DIAGNOSIS — M19071 Primary osteoarthritis, right ankle and foot: Secondary | ICD-10-CM | POA: Diagnosis not present

## 2015-02-27 DIAGNOSIS — M170 Bilateral primary osteoarthritis of knee: Secondary | ICD-10-CM | POA: Diagnosis not present

## 2015-03-18 ENCOUNTER — Other Ambulatory Visit: Payer: Self-pay

## 2015-03-18 DIAGNOSIS — Z1231 Encounter for screening mammogram for malignant neoplasm of breast: Secondary | ICD-10-CM

## 2015-04-22 ENCOUNTER — Ambulatory Visit
Admission: RE | Admit: 2015-04-22 | Discharge: 2015-04-22 | Disposition: A | Discharge status: Home / Self Care | Payer: BLUE CROSS/BLUE SHIELD | Source: Ambulatory Visit

## 2015-04-22 DIAGNOSIS — Z1231 Encounter for screening mammogram for malignant neoplasm of breast: Secondary | ICD-10-CM

## 2015-04-23 ENCOUNTER — Other Ambulatory Visit: Payer: Self-pay | Admitting: Internal Medicine

## 2015-04-23 DIAGNOSIS — R928 Other abnormal and inconclusive findings on diagnostic imaging of breast: Secondary | ICD-10-CM

## 2015-04-30 ENCOUNTER — Ambulatory Visit
Admission: RE | Admit: 2015-04-30 | Discharge: 2015-04-30 | Disposition: A | Discharge status: Home / Self Care | Payer: BLUE CROSS/BLUE SHIELD | Source: Ambulatory Visit | Attending: Internal Medicine | Admitting: Internal Medicine

## 2015-04-30 DIAGNOSIS — R928 Other abnormal and inconclusive findings on diagnostic imaging of breast: Secondary | ICD-10-CM

## 2015-05-07 ENCOUNTER — Encounter: Payer: Self-pay | Admitting: Family Medicine

## 2015-05-07 DIAGNOSIS — F32A Depression, unspecified: Secondary | ICD-10-CM | POA: Insufficient documentation

## 2015-05-07 DIAGNOSIS — M1990 Unspecified osteoarthritis, unspecified site: Secondary | ICD-10-CM | POA: Insufficient documentation

## 2015-05-07 DIAGNOSIS — F329 Major depressive disorder, single episode, unspecified: Secondary | ICD-10-CM | POA: Insufficient documentation

## 2015-05-07 DIAGNOSIS — I10 Essential (primary) hypertension: Secondary | ICD-10-CM | POA: Insufficient documentation

## 2015-07-01 ENCOUNTER — Ambulatory Visit (INDEPENDENT_AMBULATORY_CARE_PROVIDER_SITE_OTHER): Payer: BLUE CROSS/BLUE SHIELD | Admitting: Family Medicine

## 2015-07-01 ENCOUNTER — Encounter: Payer: Self-pay | Admitting: Family Medicine

## 2015-07-01 VITALS — BP 130/74 | HR 80 | Temp 98.5°F | Resp 16 | Ht 64.0 in | Wt 282.0 lb

## 2015-07-01 DIAGNOSIS — Z1211 Encounter for screening for malignant neoplasm of colon: Secondary | ICD-10-CM | POA: Diagnosis not present

## 2015-07-01 DIAGNOSIS — Z7189 Other specified counseling: Secondary | ICD-10-CM | POA: Diagnosis not present

## 2015-07-01 DIAGNOSIS — Z0000 Encounter for general adult medical examination without abnormal findings: Secondary | ICD-10-CM | POA: Diagnosis not present

## 2015-07-01 DIAGNOSIS — Z7689 Persons encountering health services in other specified circumstances: Secondary | ICD-10-CM

## 2015-07-01 DIAGNOSIS — Z124 Encounter for screening for malignant neoplasm of cervix: Secondary | ICD-10-CM

## 2015-07-01 DIAGNOSIS — Z01419 Encounter for gynecological examination (general) (routine) without abnormal findings: Secondary | ICD-10-CM

## 2015-07-01 DIAGNOSIS — Z23 Encounter for immunization: Secondary | ICD-10-CM

## 2015-07-01 MED ORDER — LISINOPRIL-HYDROCHLOROTHIAZIDE 20-12.5 MG PO TABS: 1.0000 | ORAL_TABLET | Freq: Every day | ORAL | Status: DC

## 2015-07-01 NOTE — Addendum Note (Signed)
Addended by: Shary Decamp B on: 07/01/2015 12:09 PM   Modules accepted: Orders
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
## 2015-07-03 ENCOUNTER — Other Ambulatory Visit: Payer: Medicare Other

## 2015-07-03 LAB — CBC WITH DIFFERENTIAL/PLATELET
Basophils Absolute: 0 10*3/uL (ref 0.0–0.1)
Basophils Relative: 0 % (ref 0–1)
Eosinophils Absolute: 0.1 10*3/uL (ref 0.0–0.7)
Eosinophils Relative: 1 % (ref 0–5)
HCT: 39.7 % (ref 36.0–46.0)
Hemoglobin: 12.7 g/dL (ref 12.0–15.0)
Lymphocytes Relative: 37 % (ref 12–46)
Lymphs Abs: 3 10*3/uL (ref 0.7–4.0)
MCH: 27.4 pg (ref 26.0–34.0)
MCHC: 32 g/dL (ref 30.0–36.0)
MCV: 85.6 fL (ref 78.0–100.0)
MPV: 10.3 fL (ref 8.6–12.4)
Monocytes Absolute: 0.7 10*3/uL (ref 0.1–1.0)
Monocytes Relative: 9 % (ref 3–12)
Neutro Abs: 4.3 10*3/uL (ref 1.7–7.7)
Neutrophils Relative %: 53 % (ref 43–77)
Platelets: 269 10*3/uL (ref 150–400)
RBC: 4.64 MIL/uL (ref 3.87–5.11)
RDW: 15.9 % — ABNORMAL HIGH (ref 11.5–15.5)
WBC: 8.1 10*3/uL (ref 4.0–10.5)

## 2015-07-03 LAB — COMPLETE METABOLIC PANEL WITH GFR
ALT: 10 U/L (ref 6–29)
AST: 11 U/L (ref 10–35)
Albumin: 3.4 g/dL — ABNORMAL LOW (ref 3.6–5.1)
Alkaline Phosphatase: 62 U/L (ref 33–130)
BUN: 15 mg/dL (ref 7–25)
CO2: 30 mmol/L (ref 20–31)
Calcium: 8.9 mg/dL (ref 8.6–10.4)
Chloride: 100 mmol/L (ref 98–110)
Creat: 0.82 mg/dL (ref 0.50–1.05)
GFR, Est African American: >89 mL/min (ref >=60)
GFR, Est Non African American: 81 mL/min (ref >=60)
Glucose, Bld: 92 mg/dL (ref 70–99)
Potassium: 3.9 mmol/L (ref 3.5–5.3)
Sodium: 138 mmol/L (ref 135–146)
Total Bilirubin: 0.4 mg/dL (ref 0.2–1.2)
Total Protein: 6.8 g/dL (ref 6.1–8.1)

## 2015-07-03 LAB — LIPID PANEL
Cholesterol: 188 mg/dL (ref 125–200)
HDL: 41 mg/dL — ABNORMAL LOW (ref >=46)
LDL Cholesterol: 119 mg/dL (ref <130)
Total CHOL/HDL Ratio: 4.6 Ratio (ref <=5.0)
Triglycerides: 141 mg/dL (ref <150)
VLDL: 28 mg/dL (ref <30)

## 2015-07-04 ENCOUNTER — Encounter: Payer: Self-pay | Admitting: Family Medicine

## 2015-07-04 LAB — VITAMIN D 25 HYDROXY (VIT D DEFICIENCY, FRACTURES): Vit D, 25-Hydroxy: 20 ng/mL — ABNORMAL LOW (ref 30–100)

## 2015-07-05 ENCOUNTER — Other Ambulatory Visit: Payer: Self-pay

## 2015-07-05 ENCOUNTER — Telehealth: Payer: Self-pay

## 2015-07-05 NOTE — Telephone Encounter (Signed)
Gastroenterology Pre-Procedure Review  Request Date:  Requesting Physician: Dr.   PATIENT REVIEW QUESTIONS: The patient responded to the following health history questions as indicated:    1. Are you having any GI issues? no 2. Do you have a personal history of Polyps? no 3. Do you have a family history of Colon Cancer or Polyps? no 4. Diabetes Mellitus? no 5. Joint replacements in the past 12 months?no 6. Major health problems in the past 3 months?no 7. Any artificial heart valves, MVP, or defibrillator?no    MEDICATIONS & ALLERGIES:    Patient reports the following regarding taking any anticoagulation/antiplatelet therapy:   Plavix, Coumadin, Eliquis, Xarelto, Lovenox, Pradaxa, Brilinta, or Effient? no Aspirin? no  Patient confirms/reports the following medications:  Current Outpatient Prescriptions  Medication Sig Dispense Refill  . lisinopril-hydrochlorothiazide (PRINZIDE,ZESTORETIC) 20-12.5 MG tablet Take 1 tablet by mouth daily. 30 tablet 11  . meloxicam (MOBIC) 15 MG tablet Take 15 mg by mouth daily.    . Vitamin D, Ergocalciferol, (DRISDOL) 50000 UNITS CAPS capsule Take 50,000 Units by mouth every 7 (seven) days.     No current facility-administered medications for this visit.    Patient confirms/reports the following allergies:  No Known Allergies  No orders of the defined types were placed in this encounter.    AUTHORIZATION INFORMATION Primary Insurance: 1D#: Group #:  Secondary Insurance: 1D#: Group #:  SCHEDULE INFORMATION: Date: 08/20/15 Time: Location: [place]

## 2015-08-15 ENCOUNTER — Telehealth: Payer: Self-pay | Admitting: Gastroenterology

## 2015-08-15 NOTE — Telephone Encounter (Signed)
Lost all paper work for colonoscopy including rx

## 2015-08-16 ENCOUNTER — Other Ambulatory Visit: Payer: Self-pay

## 2015-08-16 DIAGNOSIS — Z1211 Encounter for screening for malignant neoplasm of colon: Secondary | ICD-10-CM

## 2015-08-16 MED ORDER — NA SULFATE-K SULFATE-MG SULF 17.5-3.13-1.6 GM/177ML PO SOLN: 1.0000 | ORAL | Status: DC

## 2015-08-16 NOTE — Telephone Encounter (Signed)
Left vm letting pt know paperwork has been emailed to her and rx was sent to pt's pharmacy.

## 2015-08-20 ENCOUNTER — Encounter: Payer: Self-pay | Admitting: Anesthesiology

## 2015-08-20 ENCOUNTER — Ambulatory Visit
Admission: RE | Admit: 2015-08-20 | Discharge: 2015-08-20 | Disposition: A | Discharge status: Home / Self Care | Payer: BLUE CROSS/BLUE SHIELD | Source: Ambulatory Visit | Attending: Gastroenterology | Admitting: Gastroenterology

## 2015-08-20 ENCOUNTER — Ambulatory Visit: Payer: BLUE CROSS/BLUE SHIELD | Admitting: Certified Registered"

## 2015-08-20 ENCOUNTER — Encounter
Admission: RE | Disposition: A | Discharge status: Home / Self Care | Payer: Self-pay | Source: Ambulatory Visit | Attending: Gastroenterology

## 2015-08-20 DIAGNOSIS — Z8041 Family history of malignant neoplasm of ovary: Secondary | ICD-10-CM | POA: Insufficient documentation

## 2015-08-20 DIAGNOSIS — Z8249 Family history of ischemic heart disease and other diseases of the circulatory system: Secondary | ICD-10-CM | POA: Insufficient documentation

## 2015-08-20 DIAGNOSIS — Z79899 Other long term (current) drug therapy: Secondary | ICD-10-CM | POA: Insufficient documentation

## 2015-08-20 DIAGNOSIS — Z6841 Body Mass Index (BMI) 40.0 and over, adult: Secondary | ICD-10-CM | POA: Insufficient documentation

## 2015-08-20 DIAGNOSIS — Z809 Family history of malignant neoplasm, unspecified: Secondary | ICD-10-CM | POA: Insufficient documentation

## 2015-08-20 DIAGNOSIS — Z8261 Family history of arthritis: Secondary | ICD-10-CM | POA: Insufficient documentation

## 2015-08-20 DIAGNOSIS — K5730 Diverticulosis of large intestine without perforation or abscess without bleeding: Secondary | ICD-10-CM | POA: Diagnosis not present

## 2015-08-20 DIAGNOSIS — Z1211 Encounter for screening for malignant neoplasm of colon: Secondary | ICD-10-CM | POA: Insufficient documentation

## 2015-08-20 DIAGNOSIS — M1990 Unspecified osteoarthritis, unspecified site: Secondary | ICD-10-CM | POA: Insufficient documentation

## 2015-08-20 DIAGNOSIS — Z823 Family history of stroke: Secondary | ICD-10-CM | POA: Insufficient documentation

## 2015-08-20 DIAGNOSIS — F329 Major depressive disorder, single episode, unspecified: Secondary | ICD-10-CM | POA: Insufficient documentation

## 2015-08-20 DIAGNOSIS — Z8051 Family history of malignant neoplasm of kidney: Secondary | ICD-10-CM | POA: Insufficient documentation

## 2015-08-20 DIAGNOSIS — I10 Essential (primary) hypertension: Secondary | ICD-10-CM | POA: Insufficient documentation

## 2015-08-20 DIAGNOSIS — D125 Benign neoplasm of sigmoid colon: Secondary | ICD-10-CM | POA: Diagnosis not present

## 2015-08-20 DIAGNOSIS — Z833 Family history of diabetes mellitus: Secondary | ICD-10-CM | POA: Diagnosis not present

## 2015-08-20 HISTORY — PX: COLONOSCOPY WITH PROPOFOL: SHX5780

## 2015-08-20 SURGERY — COLONOSCOPY WITH PROPOFOL
Anesthesia: General

## 2015-08-20 MED ORDER — SODIUM CHLORIDE 0.9 % IV SOLN
INTRAVENOUS | Status: DC
  Administered 2015-08-20: 09:00:00 via INTRAVENOUS

## 2015-08-20 MED ORDER — LIDOCAINE HCL (PF) 1 % IJ SOLN
INTRAMUSCULAR | Status: AC
  Filled 2015-08-20: qty 2

## 2015-08-20 MED ORDER — LIDOCAINE HCL (CARDIAC) 20 MG/ML IV SOLN
INTRAVENOUS | Status: DC | PRN
  Administered 2015-08-20: 60 mg via INTRAVENOUS

## 2015-08-20 MED ORDER — PROPOFOL 10 MG/ML IV BOLUS
INTRAVENOUS | Status: DC | PRN
  Administered 2015-08-20: 50 mg via INTRAVENOUS

## 2015-08-20 MED ORDER — PROPOFOL 500 MG/50ML IV EMUL
INTRAVENOUS | Status: DC | PRN
Start: 2015-08-20 — End: 2015-08-20
  Administered 2015-08-20: 110 ug/kg/min via INTRAVENOUS

## 2015-08-20 NOTE — Anesthesia Preprocedure Evaluation (Signed)
Anesthesia Evaluation  Patient identified by MRN, date of birth, ID band Patient awake    Reviewed: Allergy & Precautions, H&P , NPO status , Patient's Chart, lab work & pertinent test results, reviewed documented beta blocker date and time   History of Anesthesia Complications Negative for: history of anesthetic complications  Airway Mallampati: III  TM Distance: >3 FB Neck ROM: full    Dental no notable dental hx. (+) Chipped   Pulmonary neg pulmonary ROS, neg shortness of breath, neg sleep apnea, neg COPD, Recent URI , Resolved,    Pulmonary exam normal breath sounds clear to auscultation       Cardiovascular Exercise Tolerance: Good hypertension, On Medications (-) angina(-) CAD, (-) Past MI, (-) Cardiac Stents and (-) CABG Normal cardiovascular exam(-) dysrhythmias (-) Valvular Problems/Murmurs Rhythm:regular Rate:Normal     Neuro/Psych Seizures - (one episode secondary to medication),  PSYCHIATRIC DISORDERS (Depression)    GI/Hepatic negative GI ROS, Neg liver ROS,   Endo/Other  neg diabetesMorbid obesity  Renal/GU negative Renal ROS  negative genitourinary   Musculoskeletal   Abdominal   Peds  Hematology negative hematology ROS (+)   Anesthesia Other Findings Past Medical History:   Wears glasses                                                  Comment:reading   Hypertension                                                 Arthritis                                                      Comment:spiral fracture in left leg (knee and ankle)   Depression                                                   Reproductive/Obstetrics negative OB ROS                             Anesthesia Physical Anesthesia Plan  ASA: III  Anesthesia Plan: General   Post-op Pain Management:    Induction:   Airway Management Planned:   Additional Equipment:   Intra-op Plan:   Post-operative Plan:    Informed Consent: I have reviewed the patients History and Physical, chart, labs and discussed the procedure including the risks, benefits and alternatives for the proposed anesthesia with the patient or authorized representative who has indicated his/her understanding and acceptance.   Dental Advisory Given  Plan Discussed with: Anesthesiologist, CRNA and Surgeon  Anesthesia Plan Comments:         Anesthesia Quick Evaluation

## 2015-08-20 NOTE — H&P (Signed)
[facility]  [address], [place], [place] [address] Phone: [phone] Fax : [phone]  Primary Care Physician:  Odette Fraction, MD Primary Gastroenterologist:  Dr. Allen Norris  Pre-Procedure History & Physical: HPI:  Kristine Blackburn is a 54 y.o. female is here for a screening colonoscopy.   Past Medical History  Diagnosis Date  . Wears glasses     reading  . Hypertension   . Arthritis     spiral fracture in left leg (knee and ankle)  . Depression     Past Surgical History  Procedure Laterality Date  . Orif tibia & fibula fractures  1999    left   . Tonsillectomy    . Dilation and curettage of uterus    . Colonoscopy    . Mass excision Left 07/31/2014    Procedure: EXCISION OF FLANK MASS;  Surgeon: Coralie Keens, MD;  Location: [facility];  Service: General;  Laterality: Left;  . Fracture surgery      Prior to Admission medications   Medication Sig Start Date End Date Taking? Authorizing Provider  lisinopril-hydrochlorothiazide (PRINZIDE,ZESTORETIC) 20-12.5 MG tablet Take 1 tablet by mouth daily. 07/01/15   Susy Frizzle, MD  meloxicam (MOBIC) 15 MG tablet Take 15 mg by mouth daily.    Historical Provider, MD  Na Sulfate-K Sulfate-Mg Sulf (SUPREP BOWEL PREP) SOLN Take 1 kit by mouth as directed. 08/16/15   Lucilla Lame, MD  Vitamin D, Ergocalciferol, (DRISDOL) 50000 UNITS CAPS capsule Take 50,000 Units by mouth every 7 (seven) days.    Historical Provider, MD    Allergies as of 07/05/2015  . (No Known Allergies)    Family History  Problem Relation Age of Onset  . Hypertension Mother   . Cancer Mother     ovarian  . Hyperlipidemia Father   . Hypertension Father   . Heart disease Father   . Cancer Maternal Grandmother     unknown  . Hypertension Maternal Grandmother   . Early death Maternal Grandfather   . Hypertension Paternal Grandmother   . Kidney disease Paternal Grandmother   . Stroke Paternal Grandmother   .  Cancer Paternal Grandmother     kidney  . Arthritis Paternal Grandfather   . Diabetes Paternal Grandfather   . Hearing loss Paternal Grandfather   . Heart disease Paternal Grandfather   . Hypertension Paternal Grandfather     Social History   Social History  . Marital Status: Married    Spouse Name: N/A  . Number of Children: N/A  . Years of Education: N/A   Occupational History  . Not on file.   Social History Main Topics  . Smoking status: Never Smoker   . Smokeless tobacco: Never Used  . Alcohol Use: No  . Drug Use: No  . Sexual Activity: Yes    Birth Control/ Protection: Condom   Other Topics Concern  . Not on file   Social History Narrative    Review of Systems: See HPI, otherwise negative ROS  Physical Exam: BP 92/77 mmHg  Pulse 73  Temp(Src) 97.5 F (36.4 C) (Oral)  Resp 20  Ht '5\' 4"'  (1.626 m)  Wt 280 lb (127.007 kg)  BMI 48.04 kg/m2  SpO2 100%  LMP 01/27/2011 General:   Alert,  pleasant and cooperative in NAD Head:  Normocephalic and atraumatic. Neck:  Supple; no masses or thyromegaly. Lungs:  Clear throughout to auscultation.    Heart:  Regular rate and rhythm. Abdomen:  Soft, nontender  and nondistended. Normal bowel sounds, without guarding, and without rebound.   Neurologic:  Alert and  oriented x4;  grossly normal neurologically.  Impression/Plan: Kristine Blackburn is now here to undergo a screening colonoscopy.  Risks, benefits, and alternatives regarding colonoscopy have been reviewed with the patient.  Questions have been answered.  All parties agreeable.

## 2015-08-20 NOTE — Transfer of Care (Signed)
Immediate Anesthesia Transfer of Care Note  Patient: Kristine Blackburn  Procedure(s) Performed: Procedure(s): COLONOSCOPY WITH PROPOFOL (N/A)  Patient Location: Endoscopy Unit  Anesthesia Type:General  Level of Consciousness: awake, alert , oriented and patient cooperative  Airway & Oxygen Therapy: Patient Spontanous Breathing and Patient connected to nasal cannula oxygen  Post-op Assessment: Report given to RN, Post -op Vital signs reviewed and stable and Patient moving all extremities X 4  Post vital signs: Reviewed and stable  Last Vitals:  Filed Vitals:   08/20/15 0749  BP: 92/77  Pulse: 73  Temp: 36.4 C  Resp: 20    Complications: No apparent anesthesia complications

## 2015-08-20 NOTE — Op Note (Signed)
[facility] Patient Name: Kristine Blackburn Procedure Date: 08/20/2015 9:12 AM MRN: [id number] Account #: [id number] Date of Birth: 1960-10-15 Admit Type: Outpatient Age: 55 Room: [facility] ENDO ROOM 4 Gender: Female Note Status: Finalized Procedure:            Colonoscopy Indications:          Screening for colorectal malignant neoplasm Providers:            Lucilla Lame, MD Referring MD:         Cammie Mcgee. Dennard Schaumann, MD (Referring MD) Medicines:            Propofol per Anesthesia Complications:        No immediate complications. Procedure:            Pre-Anesthesia Assessment:                       - Prior to the procedure, a History and Physical was                        performed, and patient medications and allergies were                        reviewed. The patient's tolerance of previous                        anesthesia was also reviewed. The risks and benefits of                        the procedure and the sedation options and risks were                        discussed with the patient. All questions were                        answered, and informed consent was obtained. Prior                        Anticoagulants: The patient has taken no previous                        anticoagulant or antiplatelet agents. ASA Grade                        Assessment: II - A patient with mild systemic disease.                        After reviewing the risks and benefits, the patient was                        deemed in satisfactory condition to undergo the                        procedure.                       After obtaining informed consent, the colonoscope was                        passed under direct vision. Throughout the procedure,  the patient's blood pressure, pulse, and oxygen                        saturations were monitored continuously. The                        Colonoscope was introduced through the anus and          advanced to the the cecum, identified by appendiceal                        orifice and ileocecal valve. The colonoscopy was                        performed without difficulty. The patient tolerated the                        procedure well. The quality of the bowel preparation                        was good. Findings:      The perianal and digital rectal examinations were normal.      A 6 mm polyp was found in the sigmoid colon. The polyp was sessile. The       polyp was removed with a cold snare. Resection and retrieval were       complete.      A 3 mm polyp was found in the sigmoid colon. The polyp was sessile. The       polyp was removed with a cold biopsy forceps. Resection and retrieval       were complete.      A few small-mouthed diverticula were found in the sigmoid colon. Impression:           - One 6 mm polyp in the sigmoid colon, removed with a                        cold snare. Resected and retrieved.                       - One 3 mm polyp in the sigmoid colon, removed with a                        cold biopsy forceps. Resected and retrieved.                       - Diverticulosis in the sigmoid colon. Recommendation:       - Repeat colonoscopy in 5 years if polyp adenoma and 10                        years if hyperplastic Procedure Code(s):    --- Professional ---                       [phone], Colonoscopy, flexible; with removal of tumor(s),                        polyp(s), or other lesion(s) by snare technique                       [id number], 59, Colonoscopy, flexible; with biopsy, single  or multiple Diagnosis Code(s):    --- Professional ---                       Z12.11, Encounter for screening for malignant neoplasm                        of colon                       D12.5, Benign neoplasm of sigmoid colon CPT copyright 2016 American Medical Association. All rights reserved. The codes documented in this report are preliminary and upon coder  review may  be revised to meet current compliance requirements. Lucilla Lame, MD 08/20/2015 9:32:44 AM This report has been signed electronically. Number of Addenda: 0 Note Initiated On: 08/20/2015 9:12 AM Scope Withdrawal Time: 0 hours 7 minutes 13 seconds  Total Procedure Duration: 0 hours 13 minutes 21 seconds       [facility]

## 2015-08-21 ENCOUNTER — Encounter: Payer: Self-pay | Admitting: Gastroenterology

## 2015-08-21 LAB — SURGICAL PATHOLOGY

## 2015-08-22 MED ORDER — LIDOCAINE HCL (PF) 1 % IJ SOLN
2.0000 mL | Freq: Once | INTRAMUSCULAR | Status: AC
  Administered 2015-08-20: 2 mL via INTRADERMAL

## 2015-08-22 NOTE — Anesthesia Postprocedure Evaluation (Signed)
Anesthesia Post Note  Patient: Kristine Blackburn  Procedure(s) Performed: Procedure(s) (LRB): COLONOSCOPY WITH PROPOFOL (N/A)  Patient location during evaluation: Endoscopy Anesthesia Type: General Level of consciousness: awake and alert Pain management: pain level controlled Vital Signs Assessment: post-procedure vital signs reviewed and stable Respiratory status: spontaneous breathing, nonlabored ventilation, respiratory function stable and patient connected to nasal cannula oxygen Cardiovascular status: blood pressure returned to baseline and stable Postop Assessment: no signs of nausea or vomiting Anesthetic complications: no    Last Vitals:  Filed Vitals:   08/20/15 0950 08/20/15 1010  BP: 118/55 119/78  Pulse:  75  Temp:    Resp:  16    Last Pain:  Filed Vitals:   08/21/15 0736  PainSc: 0-No pain                 Martha Clan

## 2015-08-23 ENCOUNTER — Encounter: Payer: Self-pay | Admitting: Gastroenterology

## 2015-11-14 DIAGNOSIS — I609 Nontraumatic subarachnoid hemorrhage, unspecified: Secondary | ICD-10-CM

## 2015-11-14 HISTORY — DX: Nontraumatic subarachnoid hemorrhage, unspecified: I60.9

## 2015-12-06 ENCOUNTER — Telehealth: Payer: Self-pay | Admitting: Family Medicine

## 2015-12-06 NOTE — Telephone Encounter (Signed)
[employer] phone number [phone] Patients husband calling to see if he can get foam to go into her collar around her neck to be more comfortable for her and also a shower collar if possible to so the other dosent get wet  [phone] if any questions call Chrissie Noa

## 2015-12-07 ENCOUNTER — Encounter: Payer: Self-pay | Admitting: Family Medicine

## 2015-12-09 NOTE — Telephone Encounter (Signed)
Spoke to pt's husband and will call [employer] to get exactly what I need to send over RX

## 2015-12-09 NOTE — Telephone Encounter (Signed)
LMTRC

## 2015-12-10 NOTE — Telephone Encounter (Signed)
Called [employer] and order faxed

## 2015-12-20 ENCOUNTER — Encounter: Payer: Self-pay | Admitting: Family Medicine

## 2015-12-20 ENCOUNTER — Ambulatory Visit (INDEPENDENT_AMBULATORY_CARE_PROVIDER_SITE_OTHER): Payer: BLUE CROSS/BLUE SHIELD | Admitting: Family Medicine

## 2015-12-20 VITALS — BP 110/74 | HR 86 | Temp 98.3°F | Resp 18 | Wt 268.0 lb

## 2015-12-20 DIAGNOSIS — Z09 Encounter for follow-up examination after completed treatment for conditions other than malignant neoplasm: Secondary | ICD-10-CM

## 2015-12-20 DIAGNOSIS — S02110A Type I occipital condyle fracture, initial encounter for closed fracture: Secondary | ICD-10-CM
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
## 2015-12-21 LAB — CBC WITH DIFFERENTIAL/PLATELET
Basophils Absolute: 0 cells/uL (ref 0–200)
Basophils Relative: 0 %
Eosinophils Absolute: 92 cells/uL (ref 15–500)
Eosinophils Relative: 1 %
HCT: 37.2 % (ref 35.0–45.0)
Hemoglobin: 11.9 g/dL — ABNORMAL LOW (ref 12.0–15.0)
Lymphocytes Relative: 38 %
Lymphs Abs: 3496 cells/uL (ref 850–3900)
MCH: 27.6 pg (ref 27.0–33.0)
MCHC: 32 g/dL (ref 32.0–36.0)
MCV: 86.3 fL (ref 80.0–100.0)
MPV: 10.7 fL (ref 7.5–12.5)
Monocytes Absolute: 736 cells/uL (ref 200–950)
Monocytes Relative: 8 %
Neutro Abs: 4876 cells/uL (ref 1500–7800)
Neutrophils Relative %: 53 %
Platelets: 330 10*3/uL (ref 140–400)
RBC: 4.31 MIL/uL (ref 3.80–5.10)
RDW: 16 % — ABNORMAL HIGH (ref 11.0–15.0)
WBC: 9.2 10*3/uL (ref 3.8–10.8)

## 2015-12-21 LAB — COMPLETE METABOLIC PANEL WITH GFR
ALT: 12 U/L (ref 6–29)
AST: 12 U/L (ref 10–35)
Albumin: 3.4 g/dL — ABNORMAL LOW (ref 3.6–5.1)
Alkaline Phosphatase: 91 U/L (ref 33–130)
BUN: 12 mg/dL (ref 7–25)
CO2: 28 mmol/L (ref 20–31)
Calcium: 9 mg/dL (ref 8.6–10.4)
Chloride: 98 mmol/L (ref 98–110)
Creat: 0.8 mg/dL (ref 0.50–1.05)
GFR, Est African American: >89 mL/min (ref >=60)
GFR, Est Non African American: 83 mL/min (ref >=60)
Glucose, Bld: 86 mg/dL (ref 70–99)
Potassium: 3.8 mmol/L (ref 3.5–5.3)
Sodium: 138 mmol/L (ref 135–146)
Total Bilirubin: 0.3 mg/dL (ref 0.2–1.2)
Total Protein: 6.7 g/dL (ref 6.1–8.1)

## 2016-01-09 ENCOUNTER — Other Ambulatory Visit: Payer: Self-pay | Admitting: Neurosurgery

## 2016-01-09 DIAGNOSIS — S02113D Unspecified occipital condyle fracture, subsequent encounter for fracture with routine healing: Secondary | ICD-10-CM

## 2016-01-13 ENCOUNTER — Other Ambulatory Visit: Payer: Self-pay | Admitting: Family Medicine

## 2016-01-14 NOTE — Telephone Encounter (Signed)
Refill appropriate and filled per protocol. 

## 2016-01-21 ENCOUNTER — Other Ambulatory Visit: Payer: BLUE CROSS/BLUE SHIELD

## 2016-01-21 ENCOUNTER — Ambulatory Visit
Admission: RE | Admit: 2016-01-21 | Discharge: 2016-01-21 | Disposition: A | Discharge status: Home / Self Care | Payer: BLUE CROSS/BLUE SHIELD | Source: Ambulatory Visit | Attending: Neurosurgery | Admitting: Neurosurgery

## 2016-01-21 DIAGNOSIS — S02113D Unspecified occipital condyle fracture, subsequent encounter for fracture with routine healing: Secondary | ICD-10-CM

## 2016-01-27 ENCOUNTER — Telehealth: Payer: Self-pay | Admitting: Family Medicine
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
## 2016-01-28 MED ORDER — TRAMADOL HCL 50 MG PO TABS: ORAL_TABLET | ORAL | 0 refills | Status: DC

## 2016-01-28 NOTE — Telephone Encounter (Signed)
Patient aware and meds called to pharm

## 2016-01-28 NOTE — Telephone Encounter (Signed)
Ok to refill 

## 2016-01-28 NOTE — Telephone Encounter (Signed)
Sure (90)

## 2016-02-07 ENCOUNTER — Other Ambulatory Visit: Payer: Self-pay | Admitting: Family Medicine

## 2016-02-07 ENCOUNTER — Telehealth: Payer: Self-pay | Admitting: Family Medicine

## 2016-02-07 DIAGNOSIS — R0781 Pleurodynia: Secondary | ICD-10-CM

## 2016-02-07 NOTE — Telephone Encounter (Signed)
I placed order at [facility] imaging

## 2016-02-07 NOTE — Telephone Encounter (Signed)
Pt's husband aware...
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
## 2016-02-10 ENCOUNTER — Ambulatory Visit
Admission: RE | Admit: 2016-02-10 | Discharge: 2016-02-10 | Disposition: A | Discharge status: Home / Self Care | Payer: BLUE CROSS/BLUE SHIELD | Source: Ambulatory Visit | Attending: Family Medicine | Admitting: Family Medicine

## 2016-02-10 DIAGNOSIS — R0781 Pleurodynia: Secondary | ICD-10-CM

## 2016-02-13 ENCOUNTER — Other Ambulatory Visit: Payer: Self-pay | Admitting: Neurosurgery

## 2016-02-13 DIAGNOSIS — S02113D Unspecified occipital condyle fracture, subsequent encounter for fracture with routine healing: Secondary | ICD-10-CM

## 2016-02-20 ENCOUNTER — Other Ambulatory Visit: Payer: Self-pay | Admitting: Family Medicine

## 2016-02-27 ENCOUNTER — Other Ambulatory Visit: Payer: Self-pay | Admitting: Family Medicine

## 2016-03-02 ENCOUNTER — Ambulatory Visit
Admission: RE | Admit: 2016-03-02 | Discharge: 2016-03-02 | Disposition: A | Discharge status: Home / Self Care | Payer: BLUE CROSS/BLUE SHIELD | Source: Ambulatory Visit | Attending: Neurosurgery | Admitting: Neurosurgery

## 2016-03-02 DIAGNOSIS — S02113D Unspecified occipital condyle fracture, subsequent encounter for fracture with routine healing: Secondary | ICD-10-CM

## 2016-03-13 ENCOUNTER — Other Ambulatory Visit: Payer: Self-pay | Admitting: Family Medicine

## 2016-03-13 NOTE — Telephone Encounter (Signed)
Ok to refill 

## 2016-03-16 NOTE — Telephone Encounter (Signed)
ok 

## 2016-03-16 NOTE — Telephone Encounter (Signed)
Medication called to pharmacy. 

## 2016-03-31 ENCOUNTER — Other Ambulatory Visit: Payer: Self-pay | Admitting: Family Medicine

## 2016-03-31 DIAGNOSIS — Z1231 Encounter for screening mammogram for malignant neoplasm of breast: Secondary | ICD-10-CM

## 2016-04-06 ENCOUNTER — Other Ambulatory Visit: Payer: Self-pay | Admitting: Family Medicine

## 2016-04-22 ENCOUNTER — Ambulatory Visit
Admission: RE | Admit: 2016-04-22 | Discharge: 2016-04-22 | Disposition: A | Discharge status: Home / Self Care | Payer: BLUE CROSS/BLUE SHIELD | Source: Ambulatory Visit | Attending: Family Medicine | Admitting: Family Medicine

## 2016-04-22 DIAGNOSIS — Z1231 Encounter for screening mammogram for malignant neoplasm of breast: Secondary | ICD-10-CM

## 2016-05-13 ENCOUNTER — Other Ambulatory Visit: Payer: Self-pay | Admitting: Family Medicine

## 2016-05-16 ENCOUNTER — Other Ambulatory Visit: Payer: Self-pay | Admitting: Family Medicine

## 2016-07-07 ENCOUNTER — Other Ambulatory Visit: Payer: Self-pay | Admitting: Family Medicine

## 2016-07-14 ENCOUNTER — Encounter: Payer: Self-pay | Admitting: Family Medicine

## 2016-07-14 ENCOUNTER — Other Ambulatory Visit: Payer: Self-pay | Admitting: Family Medicine

## 2016-07-14 ENCOUNTER — Ambulatory Visit (INDEPENDENT_AMBULATORY_CARE_PROVIDER_SITE_OTHER): Payer: Medicare Other | Admitting: Family Medicine

## 2016-07-14 VITALS — BP 130/98 | HR 78 | Temp 99.0°F | Resp 18 | Ht 64.0 in | Wt 279.0 lb

## 2016-07-14 DIAGNOSIS — Z0000 Encounter for general adult medical examination without abnormal findings: Secondary | ICD-10-CM | POA: Diagnosis not present

## 2016-07-14 DIAGNOSIS — I10 Essential (primary) hypertension: Secondary | ICD-10-CM

## 2016-07-14 DIAGNOSIS — Z01419 Encounter for gynecological examination (general) (routine) without abnormal findings: Secondary | ICD-10-CM | POA: Diagnosis not present

## 2016-07-14 LAB — CBC WITH DIFFERENTIAL/PLATELET
Basophils Absolute: 0 cells/uL (ref 0–200)
Basophils Relative: 0 %
Eosinophils Absolute: 0 cells/uL — ABNORMAL LOW (ref 15–500)
Eosinophils Relative: 0 %
HCT: 44.9 % (ref 35.0–45.0)
Hemoglobin: 14.4 g/dL (ref 12.0–15.0)
Lymphocytes Relative: 38 %
Lymphs Abs: 2546 cells/uL (ref 850–3900)
MCH: 27.5 pg (ref 27.0–33.0)
MCHC: 32.1 g/dL (ref 32.0–36.0)
MCV: 85.9 fL (ref 80.0–100.0)
MPV: 10.3 fL (ref 7.5–12.5)
Monocytes Absolute: 938 cells/uL (ref 200–950)
Monocytes Relative: 14 %
Neutro Abs: 3216 cells/uL (ref 1500–7800)
Neutrophils Relative %: 48 %
Platelets: 283 10*3/uL (ref 140–400)
RBC: 5.23 MIL/uL — ABNORMAL HIGH (ref 3.80–5.10)
RDW: 15.9 % — ABNORMAL HIGH (ref 11.0–15.0)
WBC: 6.7 10*3/uL (ref 3.8–10.8)

## 2016-07-14 LAB — COMPLETE METABOLIC PANEL WITH GFR
ALT: 13 U/L (ref 6–29)
AST: 16 U/L (ref 10–35)
Albumin: 3.5 g/dL — ABNORMAL LOW (ref 3.6–5.1)
Alkaline Phosphatase: 72 U/L (ref 33–130)
BUN: 14 mg/dL (ref 7–25)
CO2: 32 mmol/L — ABNORMAL HIGH (ref 20–31)
Calcium: 9.2 mg/dL (ref 8.6–10.4)
Chloride: 98 mmol/L (ref 98–110)
Creat: 0.94 mg/dL (ref 0.50–1.05)
GFR, Est African American: 78 mL/min (ref >=60)
GFR, Est Non African American: 68 mL/min (ref >=60)
Glucose, Bld: 92 mg/dL (ref 70–99)
Potassium: 3.9 mmol/L (ref 3.5–5.3)
Sodium: 137 mmol/L (ref 135–146)
Total Bilirubin: 0.4 mg/dL (ref 0.2–1.2)
Total Protein: 7.6 g/dL (ref 6.1–8.1)

## 2016-07-14 LAB — LIPID PANEL
Cholesterol: 163 mg/dL (ref <200)
HDL: 41 mg/dL — ABNORMAL LOW (ref >50)
LDL Cholesterol: 91 mg/dL (ref <100)
Total CHOL/HDL Ratio: 4 Ratio (ref <5.0)
Triglycerides: 155 mg/dL — ABNORMAL HIGH (ref <150)
VLDL: 31 mg/dL — ABNORMAL HIGH (ref <30)

## 2016-07-14 MED ORDER — LISINOPRIL-HYDROCHLOROTHIAZIDE 20-12.5 MG PO TABS: 1.0000 | ORAL_TABLET | Freq: Every day | ORAL | 2 refills | Status: DC

## 2016-07-14 NOTE — Telephone Encounter (Signed)
Rx filled per protocol  
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
## 2016-09-05 ENCOUNTER — Other Ambulatory Visit: Payer: Self-pay | Admitting: Family Medicine

## 2016-09-24 ENCOUNTER — Telehealth: Payer: Self-pay | Admitting: Family Medicine

## 2016-09-24 MED ORDER — LISINOPRIL-HYDROCHLOROTHIAZIDE 20-12.5 MG PO TABS: 1.0000 | ORAL_TABLET | Freq: Every day | ORAL | 2 refills | Status: DC

## 2016-09-24 NOTE — Telephone Encounter (Signed)
[facility]  Patient is calling to get refill on her lisinopril  [phone]

## 2016-09-24 NOTE — Telephone Encounter (Signed)
Medication called/sent to requested pharmacy  

## 2016-09-28 ENCOUNTER — Ambulatory Visit (INDEPENDENT_AMBULATORY_CARE_PROVIDER_SITE_OTHER): Payer: BLUE CROSS/BLUE SHIELD | Admitting: Family Medicine

## 2016-09-28 ENCOUNTER — Encounter: Payer: Self-pay | Admitting: Family Medicine

## 2016-09-28 VITALS — BP 126/80 | HR 76 | Temp 98.0°F | Resp 18 | Ht 64.0 in | Wt 289.0 lb

## 2016-09-28 DIAGNOSIS — F321 Major depressive disorder, single episode, moderate: Secondary | ICD-10-CM

## 2016-09-28 MED ORDER — ESCITALOPRAM OXALATE 10 MG PO TABS: 10.0000 mg | ORAL_TABLET | Freq: Every day | ORAL | 5 refills | Status: DC
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
## 2016-10-27 ENCOUNTER — Other Ambulatory Visit: Payer: Self-pay | Admitting: Family Medicine

## 2016-10-27 MED ORDER — ESCITALOPRAM OXALATE 10 MG PO TABS: 10.0000 mg | ORAL_TABLET | Freq: Every day | ORAL | 3 refills | Status: DC

## 2016-10-27 MED ORDER — VITAMIN D (ERGOCALCIFEROL) 1.25 MG (50000 UNIT) PO CAPS: ORAL_CAPSULE | ORAL | 1 refills | Status: DC

## 2017-02-16 ENCOUNTER — Telehealth: Payer: Self-pay | Admitting: Family Medicine

## 2017-02-16 NOTE — Telephone Encounter (Signed)
Pt came by and brought [facility] forms that need to be filled out per pickard, put in his green folder.

## 2017-03-24 ENCOUNTER — Other Ambulatory Visit: Payer: Self-pay | Admitting: Family Medicine

## 2017-03-24 DIAGNOSIS — Z1231 Encounter for screening mammogram for malignant neoplasm of breast: Secondary | ICD-10-CM

## 2017-04-26 ENCOUNTER — Ambulatory Visit
Admission: RE | Admit: 2017-04-26 | Discharge: 2017-04-26 | Disposition: A | Discharge status: Home / Self Care | Payer: BLUE CROSS/BLUE SHIELD | Source: Ambulatory Visit | Attending: Family Medicine | Admitting: Family Medicine

## 2017-04-26 DIAGNOSIS — Z1231 Encounter for screening mammogram for malignant neoplasm of breast: Secondary | ICD-10-CM

## 2017-05-22 ENCOUNTER — Other Ambulatory Visit: Payer: Self-pay | Admitting: Family Medicine

## 2017-05-26 ENCOUNTER — Other Ambulatory Visit: Payer: Self-pay | Admitting: Family Medicine

## 2017-05-26 DIAGNOSIS — E559 Vitamin D deficiency, unspecified: Secondary | ICD-10-CM

## 2017-05-26 NOTE — Telephone Encounter (Signed)
Dr Dennard Schaumann wants pt to come have Vit D level drawn before refilling.  May be able to switch to OTC.  Pt called and made aware.  Order placed.

## 2017-05-26 NOTE — Telephone Encounter (Signed)
How long do you want pt to continue high dose Vitamin D?

## 2017-05-26 NOTE — Telephone Encounter (Signed)
I would recheck vit d level first and if normal resume vit d 1000 a day.

## 2017-05-31 ENCOUNTER — Other Ambulatory Visit: Payer: BLUE CROSS/BLUE SHIELD

## 2017-05-31 DIAGNOSIS — E559 Vitamin D deficiency, unspecified: Secondary | ICD-10-CM

## 2017-06-01 LAB — VITAMIN D 25 HYDROXY (VIT D DEFICIENCY, FRACTURES): Vit D, 25-Hydroxy: 34 ng/mL (ref 30–100)

## 2017-06-02 ENCOUNTER — Encounter: Payer: Self-pay | Admitting: Family Medicine

## 2017-06-22 ENCOUNTER — Ambulatory Visit (INDEPENDENT_AMBULATORY_CARE_PROVIDER_SITE_OTHER): Payer: Commercial Managed Care - PPO | Admitting: Family Medicine

## 2017-06-22 ENCOUNTER — Encounter: Payer: Self-pay | Admitting: Family Medicine

## 2017-06-22 VITALS — BP 156/88 | HR 83 | Temp 98.5°F | Resp 18 | Ht 64.0 in | Wt 304.0 lb

## 2017-06-22 DIAGNOSIS — K429 Umbilical hernia without obstruction or gangrene: Secondary | ICD-10-CM | POA: Diagnosis not present
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
## 2017-06-30 ENCOUNTER — Other Ambulatory Visit: Payer: Self-pay | Admitting: Family Medicine

## 2017-06-30 DIAGNOSIS — K429 Umbilical hernia without obstruction or gangrene: Secondary | ICD-10-CM

## 2017-07-16 ENCOUNTER — Other Ambulatory Visit: Payer: Self-pay | Admitting: Surgery

## 2017-07-16 DIAGNOSIS — K439 Ventral hernia without obstruction or gangrene: Secondary | ICD-10-CM | POA: Diagnosis not present

## 2017-08-03 NOTE — Pre-Procedure Instructions (Signed)
Kristine Blackburn  08/03/2017      [id number] - [place], [place] [address] [place] [address] Phone: [phone] Fax: [phone]    Your procedure is scheduled on Monday February 25.  Report to [facility] Admitting at 7:00 A.M.  Call this number if you have problems the morning of surgery:  [phone]   Remember:  Do not eat food or drink liquids after midnight.  Take these medicines the morning of surgery with A SIP OF WATER: Escitalopram (Lexapro)  7 days prior to surgery STOP taking any Aspirin(unless otherwise instructed by your surgeon), Aleve, Naproxen, Ibuprofen, Motrin, Advil, Goody's, BC's, all herbal medications, fish oil, and all vitamins    Do not wear jewelry, make-up or nail polish.  Do not wear lotions, powders, or perfumes, or deodorant.  Do not shave 48 hours prior to surgery.  Men may shave face and neck.  Do not bring valuables to the hospital.  [facility] is not responsible for any belongings or valuables.  Contacts, dentures or bridgework may not be worn into surgery.  Leave your suitcase in the car.  After surgery it may be brought to your room.  For patients admitted to the hospital, discharge time will be determined by your treatment team.  Patients discharged the day of surgery will not be allowed to drive home.   Special instructions:    [facility]- Preparing For Surgery  Before surgery, you can play an important role. Because skin is not sterile, your skin needs to be as free of germs as possible. You can reduce the number of germs on your skin by washing with CHG (chlorahexidine gluconate) Soap before surgery.  CHG is an antiseptic cleaner which kills germs and bonds with the skin to continue killing germs even after washing.  Please do not use if you have an allergy to CHG or antibacterial soaps. If your skin becomes reddened/irritated stop using the CHG.  Do not shave  (including legs and underarms) for at least 48 hours prior to first CHG shower. It is OK to shave your face.  Please follow these instructions carefully.   1. Shower the NIGHT BEFORE SURGERY and the MORNING OF SURGERY with CHG.   2. If you chose to wash your hair, wash your hair first as usual with your normal shampoo.  3. After you shampoo, rinse your hair and body thoroughly to remove the shampoo.  4. Use CHG as you would any other liquid soap. You can apply CHG directly to the skin and wash gently with a scrungie or a clean washcloth.   5. Apply the CHG Soap to your body ONLY FROM THE NECK DOWN.  Do not use on open wounds or open sores. Avoid contact with your eyes, ears, mouth and genitals (private parts). Wash Face and genitals (private parts)  with your normal soap.  6. Wash thoroughly, paying special attention to the area where your surgery will be performed.  7. Thoroughly rinse your body with warm water from the neck down.  8. DO NOT shower/wash with your normal soap after using and rinsing off the CHG Soap.  9. Pat yourself dry with a CLEAN TOWEL.  10. Wear CLEAN PAJAMAS to bed the night before surgery, wear comfortable clothes the morning of surgery  11. Place CLEAN SHEETS on your bed the night of your first shower and DO NOT SLEEP WITH PETS.    Day of  Surgery: Do not apply any deodorants/lotions. Please wear clean clothes to the hospital/surgery center.      Please read over the following fact sheets that you were given. Coughing and Deep Breathing and Surgical Site Infection Prevention

## 2017-08-04 ENCOUNTER — Encounter (HOSPITAL_COMMUNITY): Payer: Self-pay

## 2017-08-04 ENCOUNTER — Encounter (HOSPITAL_COMMUNITY)
Admission: RE | Admit: 2017-08-04 | Discharge: 2017-08-04 | Disposition: A | Discharge status: Home / Self Care | Payer: Commercial Managed Care - PPO | Source: Ambulatory Visit | Attending: Surgery | Admitting: Surgery

## 2017-08-04 ENCOUNTER — Other Ambulatory Visit: Payer: Self-pay

## 2017-08-04 DIAGNOSIS — Z01818 Encounter for other preprocedural examination: Secondary | ICD-10-CM | POA: Insufficient documentation

## 2017-08-04 DIAGNOSIS — I498 Other specified cardiac arrhythmias: Secondary | ICD-10-CM | POA: Insufficient documentation

## 2017-08-04 DIAGNOSIS — Z01812 Encounter for preprocedural laboratory examination: Secondary | ICD-10-CM | POA: Insufficient documentation

## 2017-08-04 DIAGNOSIS — I10 Essential (primary) hypertension: Secondary | ICD-10-CM | POA: Insufficient documentation

## 2017-08-04 HISTORY — DX: Dizziness and giddiness: R42

## 2017-08-04 HISTORY — DX: Other amnesia: R41.3

## 2017-08-04 LAB — BASIC METABOLIC PANEL
Anion gap: 10 (ref 5–15)
BUN: 13 mg/dL (ref 6–20)
CO2: 28 mmol/L (ref 22–32)
Calcium: 8.9 mg/dL (ref 8.9–10.3)
Chloride: 101 mmol/L (ref 101–111)
Creatinine, Ser: 0.82 mg/dL (ref 0.44–1.00)
GFR calc Af Amer: >60 mL/min (ref >60)
GFR calc non Af Amer: >60 mL/min (ref >60)
Glucose, Bld: 141 mg/dL — ABNORMAL HIGH (ref 65–99)
Potassium: 3.5 mmol/L (ref 3.5–5.1)
Sodium: 139 mmol/L (ref 135–145)

## 2017-08-04 LAB — CBC
HCT: 44.3 % (ref 36.0–46.0)
Hemoglobin: 13.8 g/dL (ref 12.0–15.0)
MCH: 28.1 pg (ref 26.0–34.0)
MCHC: 31.2 g/dL (ref 30.0–36.0)
MCV: 90.2 fL (ref 78.0–100.0)
Platelets: 245 10*3/uL (ref 150–400)
RBC: 4.91 MIL/uL (ref 3.87–5.11)
RDW: 16 % — ABNORMAL HIGH (ref 11.5–15.5)
WBC: 8 10*3/uL (ref 4.0–10.5)

## 2017-08-04 NOTE — Progress Notes (Signed)
PCP - Dr. Dennard Schaumann Cardiologist - denies cardiac workup or cardiologist  EKG - 08/04/2017   Patient denies shortness of breath, fever, cough and chest pain at PAT appointment  Patient verbalized understanding of instructions that were given to them at the PAT appointment. Patient was also instructed that they will need to review over the PAT instructions again at home before surgery.

## 2017-08-06 MED ORDER — DEXTROSE 5 % IV SOLN
3.0000 g | Freq: Once | INTRAVENOUS | Status: AC
  Administered 2017-08-09: 3 g via INTRAVENOUS
  Filled 2017-08-06: qty 3000
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
## 2017-08-09 ENCOUNTER — Ambulatory Visit (HOSPITAL_COMMUNITY): Payer: Commercial Managed Care - PPO | Admitting: Vascular Surgery

## 2017-08-09 ENCOUNTER — Ambulatory Visit (HOSPITAL_COMMUNITY): Payer: Commercial Managed Care - PPO | Admitting: Certified Registered Nurse Anesthetist

## 2017-08-09 ENCOUNTER — Observation Stay (HOSPITAL_COMMUNITY)
Admission: RE | Admit: 2017-08-09 | Discharge: 2017-08-10 | Disposition: A | Discharge status: Home / Self Care | Payer: Commercial Managed Care - PPO | Source: Ambulatory Visit | Attending: Surgery | Admitting: Surgery

## 2017-08-09 ENCOUNTER — Encounter (HOSPITAL_COMMUNITY)
Admission: RE | Disposition: A | Discharge status: Home / Self Care | Payer: Self-pay | Source: Ambulatory Visit | Attending: Surgery

## 2017-08-09 ENCOUNTER — Other Ambulatory Visit: Payer: Self-pay

## 2017-08-09 ENCOUNTER — Encounter (HOSPITAL_COMMUNITY): Payer: Self-pay | Admitting: *Deleted

## 2017-08-09 DIAGNOSIS — M1990 Unspecified osteoarthritis, unspecified site: Secondary | ICD-10-CM | POA: Diagnosis not present

## 2017-08-09 DIAGNOSIS — Z6841 Body Mass Index (BMI) 40.0 and over, adult: Secondary | ICD-10-CM | POA: Insufficient documentation

## 2017-08-09 DIAGNOSIS — K436 Other and unspecified ventral hernia with obstruction, without gangrene: Principal | ICD-10-CM | POA: Insufficient documentation

## 2017-08-09 DIAGNOSIS — I10 Essential (primary) hypertension: Secondary | ICD-10-CM | POA: Diagnosis not present

## 2017-08-09 DIAGNOSIS — K439 Ventral hernia without obstruction or gangrene: Secondary | ICD-10-CM | POA: Diagnosis not present

## 2017-08-09 DIAGNOSIS — Z79899 Other long term (current) drug therapy: Secondary | ICD-10-CM | POA: Insufficient documentation

## 2017-08-09 HISTORY — PX: LAPAROSCOPIC ASSISTED VENTRAL HERNIA REPAIR: SHX6312

## 2017-08-09 HISTORY — DX: Migraine, unspecified, not intractable, without status migrainosus: G43.909

## 2017-08-09 HISTORY — DX: Anxiety disorder, unspecified: F41.9

## 2017-08-09 HISTORY — DX: Anemia, unspecified: D64.9

## 2017-08-09 HISTORY — DX: Pneumonia, unspecified organism: J18.9

## 2017-08-09 HISTORY — DX: Vitamin D deficiency, unspecified: E55.9

## 2017-08-09 HISTORY — PX: VENTRAL HERNIA REPAIR: SHX424

## 2017-08-09 SURGERY — REPAIR, HERNIA, VENTRAL, LAPAROSCOPIC
Anesthesia: General | Site: Abdomen

## 2017-08-09 MED ORDER — LISINOPRIL-HYDROCHLOROTHIAZIDE 20-12.5 MG PO TABS: 1.0000 | ORAL_TABLET | Freq: Every day | ORAL | Status: DC

## 2017-08-09 MED ORDER — SUGAMMADEX SODIUM 200 MG/2ML IV SOLN
INTRAVENOUS | Status: DC | PRN
  Administered 2017-08-09: 400 mg via INTRAVENOUS
  Administered 2017-08-09: 100 mg via INTRAVENOUS

## 2017-08-09 MED ORDER — ONDANSETRON HCL 4 MG/2ML IJ SOLN
INTRAMUSCULAR | Status: DC | PRN
  Administered 2017-08-09: 4 mg via INTRAVENOUS

## 2017-08-09 MED ORDER — MIDAZOLAM HCL 2 MG/2ML IJ SOLN
INTRAMUSCULAR | Status: AC
  Filled 2017-08-09: qty 2

## 2017-08-09 MED ORDER — HYDROMORPHONE HCL 1 MG/ML IJ SOLN
1.0000 mg | INTRAMUSCULAR | Status: DC | PRN
Start: 2017-08-09 — End: 2017-08-10

## 2017-08-09 MED ORDER — ONDANSETRON 4 MG PO TBDP: 4.0000 mg | ORAL_TABLET | Freq: Four times a day (QID) | ORAL | Status: DC | PRN

## 2017-08-09 MED ORDER — ONDANSETRON HCL 4 MG/2ML IJ SOLN: 4.0000 mg | Freq: Four times a day (QID) | INTRAMUSCULAR | Status: DC | PRN

## 2017-08-09 MED ORDER — DIPHENHYDRAMINE HCL 50 MG/ML IJ SOLN: 25.0000 mg | Freq: Four times a day (QID) | INTRAMUSCULAR | Status: DC | PRN

## 2017-08-09 MED ORDER — ESCITALOPRAM OXALATE 10 MG PO TABS
10.0000 mg | ORAL_TABLET | Freq: Every day | ORAL | Status: DC
  Administered 2017-08-09: 10 mg via ORAL
  Filled 2017-08-09 (×2): qty 1

## 2017-08-09 MED ORDER — KETOROLAC TROMETHAMINE 30 MG/ML IJ SOLN: 30.0000 mg | Freq: Four times a day (QID) | INTRAMUSCULAR | Status: DC | PRN

## 2017-08-09 MED ORDER — BUPIVACAINE-EPINEPHRINE (PF) 0.5% -1:200000 IJ SOLN
INTRAMUSCULAR | Status: AC
  Filled 2017-08-09: qty 30

## 2017-08-09 MED ORDER — CHLORHEXIDINE GLUCONATE CLOTH 2 % EX PADS: 6.0000 | MEDICATED_PAD | Freq: Once | CUTANEOUS | Status: DC

## 2017-08-09 MED ORDER — METOCLOPRAMIDE HCL 5 MG/ML IJ SOLN
INTRAMUSCULAR | Status: AC
  Filled 2017-08-09: qty 2

## 2017-08-09 MED ORDER — ONDANSETRON HCL 4 MG/2ML IJ SOLN
INTRAMUSCULAR | Status: AC
  Filled 2017-08-09: qty 2

## 2017-08-09 MED ORDER — HYDROCODONE-ACETAMINOPHEN 5-325 MG PO TABS
1.0000 | ORAL_TABLET | ORAL | Status: DC | PRN
  Administered 2017-08-09 – 2017-08-10 (×4): 2 via ORAL
  Filled 2017-08-09 (×3): qty 2

## 2017-08-09 MED ORDER — BUPIVACAINE-EPINEPHRINE 0.5% -1:200000 IJ SOLN
INTRAMUSCULAR | Status: DC | PRN
  Administered 2017-08-09: 30 mL

## 2017-08-09 MED ORDER — POTASSIUM CHLORIDE IN NACL 20-0.9 MEQ/L-% IV SOLN
INTRAVENOUS | Status: DC
  Administered 2017-08-09: 13:00:00 via INTRAVENOUS
  Filled 2017-08-09 (×2): qty 1000

## 2017-08-09 MED ORDER — METHOCARBAMOL 500 MG PO TABS
500.0000 mg | ORAL_TABLET | Freq: Four times a day (QID) | ORAL | Status: DC | PRN
  Administered 2017-08-09 – 2017-08-10 (×3): 500 mg via ORAL
  Filled 2017-08-09 (×2): qty 1

## 2017-08-09 MED ORDER — ENOXAPARIN SODIUM 40 MG/0.4ML ~~LOC~~ SOLN
40.0000 mg | SUBCUTANEOUS | Status: DC
  Administered 2017-08-10: 40 mg via SUBCUTANEOUS
  Filled 2017-08-09: qty 0.4

## 2017-08-09 MED ORDER — METHOCARBAMOL 500 MG PO TABS
ORAL_TABLET | ORAL | Status: AC
  Filled 2017-08-09: qty 1

## 2017-08-09 MED ORDER — STERILE WATER FOR IRRIGATION IR SOLN
Status: DC | PRN
  Administered 2017-08-09: 200 mL

## 2017-08-09 MED ORDER — FENTANYL CITRATE (PF) 250 MCG/5ML IJ SOLN
INTRAMUSCULAR | Status: DC | PRN
  Administered 2017-08-09: 50 ug via INTRAVENOUS
  Administered 2017-08-09: 100 ug via INTRAVENOUS
  Administered 2017-08-09: 50 ug via INTRAVENOUS

## 2017-08-09 MED ORDER — FENTANYL CITRATE (PF) 100 MCG/2ML IJ SOLN
INTRAMUSCULAR | Status: AC
  Filled 2017-08-09: qty 2

## 2017-08-09 MED ORDER — DIPHENHYDRAMINE HCL 25 MG PO CAPS: 25.0000 mg | ORAL_CAPSULE | Freq: Four times a day (QID) | ORAL | Status: DC | PRN

## 2017-08-09 MED ORDER — DEXAMETHASONE SODIUM PHOSPHATE 10 MG/ML IJ SOLN
INTRAMUSCULAR | Status: AC
  Filled 2017-08-09: qty 1

## 2017-08-09 MED ORDER — FENTANYL CITRATE (PF) 250 MCG/5ML IJ SOLN
INTRAMUSCULAR | Status: AC
  Filled 2017-08-09: qty 5

## 2017-08-09 MED ORDER — FENTANYL CITRATE (PF) 100 MCG/2ML IJ SOLN
25.0000 ug | INTRAMUSCULAR | Status: DC | PRN
  Administered 2017-08-09 (×2): 25 ug via INTRAVENOUS
  Administered 2017-08-09: 50 ug via INTRAVENOUS

## 2017-08-09 MED ORDER — SODIUM CHLORIDE 0.9 % IR SOLN
Status: DC | PRN
  Administered 2017-08-09: 1000 mL

## 2017-08-09 MED ORDER — LACTATED RINGERS IV SOLN
INTRAVENOUS | Status: DC
  Administered 2017-08-09: 07:00:00 via INTRAVENOUS

## 2017-08-09 MED ORDER — LACTATED RINGERS IV SOLN: INTRAVENOUS | Status: DC

## 2017-08-09 MED ORDER — TRAMADOL HCL 50 MG PO TABS: 50.0000 mg | ORAL_TABLET | Freq: Four times a day (QID) | ORAL | Status: DC | PRN

## 2017-08-09 MED ORDER — ROCURONIUM BROMIDE 10 MG/ML (PF) SYRINGE
PREFILLED_SYRINGE | INTRAVENOUS | Status: DC | PRN
  Administered 2017-08-09: 80 mg via INTRAVENOUS

## 2017-08-09 MED ORDER — METOCLOPRAMIDE HCL 5 MG/ML IJ SOLN
10.0000 mg | Freq: Once | INTRAMUSCULAR | Status: AC | PRN
  Administered 2017-08-09: 10 mg via INTRAVENOUS

## 2017-08-09 MED ORDER — MEPERIDINE HCL 50 MG/ML IJ SOLN: 6.2500 mg | INTRAMUSCULAR | Status: DC | PRN

## 2017-08-09 MED ORDER — LIDOCAINE 2% (20 MG/ML) 5 ML SYRINGE
INTRAMUSCULAR | Status: AC
  Filled 2017-08-09: qty 5

## 2017-08-09 MED ORDER — DEXAMETHASONE SODIUM PHOSPHATE 10 MG/ML IJ SOLN
INTRAMUSCULAR | Status: DC | PRN
  Administered 2017-08-09: 10 mg via INTRAVENOUS

## 2017-08-09 MED ORDER — SUGAMMADEX SODIUM 500 MG/5ML IV SOLN
INTRAVENOUS | Status: AC
  Filled 2017-08-09: qty 5

## 2017-08-09 MED ORDER — LIDOCAINE 2% (20 MG/ML) 5 ML SYRINGE
INTRAMUSCULAR | Status: DC | PRN
  Administered 2017-08-09: 100 mg via INTRAVENOUS

## 2017-08-09 MED ORDER — PROPOFOL 10 MG/ML IV BOLUS
INTRAVENOUS | Status: DC | PRN
  Administered 2017-08-09: 200 mg via INTRAVENOUS

## 2017-08-09 MED ORDER — HYDROCODONE-ACETAMINOPHEN 5-325 MG PO TABS
ORAL_TABLET | ORAL | Status: AC
  Filled 2017-08-09: qty 2

## 2017-08-09 MED ORDER — MIDAZOLAM HCL 2 MG/2ML IJ SOLN
INTRAMUSCULAR | Status: DC | PRN
  Administered 2017-08-09 (×2): 1 mg via INTRAVENOUS

## 2017-08-09 MED ORDER — HYDROCHLOROTHIAZIDE 12.5 MG PO CAPS
12.5000 mg | ORAL_CAPSULE | Freq: Every day | ORAL | Status: DC
Start: 2017-08-09 — End: 2017-08-10
  Administered 2017-08-10: 12.5 mg via ORAL
  Filled 2017-08-09 (×2): qty 1

## 2017-08-09 MED ORDER — EPHEDRINE SULFATE-NACL 50-0.9 MG/10ML-% IV SOSY
PREFILLED_SYRINGE | INTRAVENOUS | Status: DC | PRN
  Administered 2017-08-09: 10 mg via INTRAVENOUS

## 2017-08-09 MED ORDER — LISINOPRIL 20 MG PO TABS
20.0000 mg | ORAL_TABLET | Freq: Every day | ORAL | Status: DC
  Administered 2017-08-10: 20 mg via ORAL
  Filled 2017-08-09 (×2): qty 1

## 2017-08-09 MED ORDER — ROCURONIUM BROMIDE 10 MG/ML (PF) SYRINGE
PREFILLED_SYRINGE | INTRAVENOUS | Status: AC
  Filled 2017-08-09: qty 5

## 2017-08-09 SURGICAL SUPPLY — 50 items
APPLIER CLIP 5 13 M/L LIGAMAX5 (MISCELLANEOUS)
APPLIER CLIP ROT 10 11.4 M/L (STAPLE)
BINDER ABDOMINAL 12 ML 46-62 (SOFTGOODS) ×3 IMPLANT
CANISTER SUCT 3000ML PPV (MISCELLANEOUS) IMPLANT
CHLORAPREP W/TINT 26ML (MISCELLANEOUS) ×3 IMPLANT
CLIP APPLIE 5 13 M/L LIGAMAX5 (MISCELLANEOUS) IMPLANT
CLIP APPLIE ROT 10 11.4 M/L (STAPLE) IMPLANT
COVER SURGICAL LIGHT HANDLE (MISCELLANEOUS) ×3 IMPLANT
DECANTER SPIKE VIAL GLASS SM (MISCELLANEOUS) IMPLANT
DERMABOND ADVANCED (GAUZE/BANDAGES/DRESSINGS) ×2
DERMABOND ADVANCED .7 DNX12 (GAUZE/BANDAGES/DRESSINGS) ×1 IMPLANT
DEVICE SECURE STRAP 25 ABSORB (INSTRUMENTS) ×3 IMPLANT
DEVICE TROCAR PUNCTURE CLOSURE (ENDOMECHANICALS) ×3 IMPLANT
DRAPE LAPAROSCOPIC ABDOMINAL (DRAPES) ×3 IMPLANT
ELECT REM PT RETURN 9FT ADLT (ELECTROSURGICAL) ×3
ELECTRODE REM PT RTRN 9FT ADLT (ELECTROSURGICAL) ×1 IMPLANT
GAUZE SPONGE 2X2 8PLY STRL LF (GAUZE/BANDAGES/DRESSINGS) ×1 IMPLANT
GLOVE BIOGEL PI IND STRL 6.5 (GLOVE) ×2 IMPLANT
GLOVE BIOGEL PI IND STRL 8 (GLOVE) ×1 IMPLANT
GLOVE BIOGEL PI INDICATOR 6.5 (GLOVE) ×4
GLOVE BIOGEL PI INDICATOR 8 (GLOVE) ×2
GLOVE SURG SIGNA 7.5 PF LTX (GLOVE) ×3 IMPLANT
GLOVE SURG SS PI 6.0 STRL IVOR (GLOVE) ×3 IMPLANT
GLOVE SURG SS PI 6.5 STRL IVOR (GLOVE) ×3 IMPLANT
GOWN STRL REUS W/ TWL LRG LVL3 (GOWN DISPOSABLE) ×2 IMPLANT
GOWN STRL REUS W/ TWL XL LVL3 (GOWN DISPOSABLE) ×1 IMPLANT
GOWN STRL REUS W/TWL LRG LVL3 (GOWN DISPOSABLE) ×4
GOWN STRL REUS W/TWL XL LVL3 (GOWN DISPOSABLE) ×2
KIT BASIN OR (CUSTOM PROCEDURE TRAY) ×3 IMPLANT
KIT ROOM TURNOVER OR (KITS) ×3 IMPLANT
MARKER SKIN DUAL TIP RULER LAB (MISCELLANEOUS) ×3 IMPLANT
MESH VENTRALIGHT ST 4.5IN (Mesh General) ×3 IMPLANT
NEEDLE SPNL 22GX3.5 QUINCKE BK (NEEDLE) ×3 IMPLANT
NS IRRIG 1000ML POUR BTL (IV SOLUTION) ×3 IMPLANT
PAD ARMBOARD 7.5X6 YLW CONV (MISCELLANEOUS) ×6 IMPLANT
SCISSORS LAP 5X35 DISP (ENDOMECHANICALS) IMPLANT
SET IRRIG TUBING LAPAROSCOPIC (IRRIGATION / IRRIGATOR) IMPLANT
SHEARS HARMONIC ACE PLUS 36CM (ENDOMECHANICALS) IMPLANT
SLEEVE ENDOPATH XCEL 5M (ENDOMECHANICALS) ×3 IMPLANT
SPONGE GAUZE 2X2 STER 10/PKG (GAUZE/BANDAGES/DRESSINGS) ×2
SUT MON AB 4-0 PC3 18 (SUTURE) ×3 IMPLANT
SUT NOVA NAB GS-21 0 18 T12 DT (SUTURE) ×3 IMPLANT
TOWEL OR 17X24 6PK STRL BLUE (TOWEL DISPOSABLE) IMPLANT
TOWEL OR 17X26 10 PK STRL BLUE (TOWEL DISPOSABLE) ×3 IMPLANT
TRAY FOLEY CATH SILVER 16FR (SET/KITS/TRAYS/PACK) IMPLANT
TRAY LAPAROSCOPIC MC (CUSTOM PROCEDURE TRAY) ×3 IMPLANT
TROCAR XCEL NON-BLD 11X100MML (ENDOMECHANICALS) ×3 IMPLANT
TROCAR XCEL NON-BLD 5MMX100MML (ENDOMECHANICALS) ×3 IMPLANT
TUBING INSUFFLATION (TUBING) ×3 IMPLANT
WATER STERILE IRR 1000ML POUR (IV SOLUTION) ×3 IMPLANT

## 2017-08-09 NOTE — Op Note (Signed)
LAPAROSCOPIC VENTRAL HERNIA REPAIR WITH MESH  Procedure Note  JASMARIE COPPOCK 08/09/2017   Pre-op Diagnosis: VENTRAL HERNIA     Post-op Diagnosis: same  Procedure(s): LAPAROSCOPIC VENTRAL HERNIA REPAIR WITH MESH  (11.4 cm round ventral ST patch from Bard)  Surgeon(s): Coralie Keens, MD  Anesthesia: General  Staff:  Circulator: Beryle Lathe, RN Scrub Person: Rozell Searing, RN; Zannie Kehr Circulator Assistant: Rozell Searing, RN  Estimated Blood Loss: Minimal               Findings: The patient was found to have a moderate sized ventral hernia at the umbilicus containing incarcerated omentum which was easily reduced.  The hernia was repaired with a 11.4 cm round ventral light ST patch from Bard  Procedure: The patient was brought to the operating room and identified as the correct patient.  She was placed supine on the operating room table and general anesthesia was induced.  Her abdomen was then prepped and draped in the usual sterile fashion.  I made a small incision in the patient's left upper quadrant with a scalpel.  I then used the 5 mm camera and Optiview port to slowly traverse all layers of the abdominal wall under direct vision and slowly gain access into the peritoneal cavity.  Insufflation was then begun.  I then evaluated the introduction site and saw no evidence of bowel injury.  We then switched to an angled scope.  An 11 mm trocar was then placed in the left lower midabdomen under direct vision.  The patient had a hernia situated around the umbilicus containing incarcerated omentum.  I was able to completely reduce the omentum bluntly.  Hemostasis appeared to be achieved.  I then measured the fascial defect.  An 11.4 cm round Prolene ventral patch coated from Bard was brought to the field.  I placed 4 separate 0 Novafil sutures into the mesh.  The mesh was then rolled up and soaked in saline.  It was then introduced through the 11 mm trocar under  direct vision.  I then unrolled the mesh.  I then made 4 separate stab incisions with a scalpel and used a suture passer under direct vision to pull out the Novafil sutures through the abdominal wall under direct vision circumferentially around the fascial defect.  The sutures were then tied in place securing the mesh up to the peritoneal surface widely around the fascial defect.  The sutures were tied in place and cut.  I then tacked the mesh in circumferentially with the secure strap absorbable tacker.  Again, wide coverage of the fascial defect appeared to be achieved.  We then again viewed the omentum and hemostasis appeared to be achieved.  All trochars were then removed under direct vision and the abdomen was deflated.  I closed the 11 mm trocar site with a 0 Vicryl suture.  I anesthetized all incisions with Marcaine and closed the skin incisions with 4-0 Monocryl and Dermabond.  Patient tolerated the procedure well.  All the counts were correct at the end of the procedure.  The patient was then extubated in the operating room and taken in a stable condition to the recovery room.          Jaqualyn Juday A   Date: 08/09/2017  Time: 9:51 AM

## 2017-08-09 NOTE — Anesthesia Procedure Notes (Signed)
Procedure Name: Intubation Date/Time: 08/09/2017 9:13 AM Performed by: Julieta Bellini, CRNA Pre-anesthesia Checklist: Patient identified, Emergency Drugs available, Patient being monitored and Suction available Patient Re-evaluated:Patient Re-evaluated prior to induction Oxygen Delivery Method: Circle system utilized Preoxygenation: Pre-oxygenation with 100% oxygen Induction Type: IV induction Ventilation: Mask ventilation without difficulty and Oral airway inserted - appropriate to patient size Laryngoscope Size: Glidescope and 3 Grade View: Grade I Tube type: Oral Tube size: 7.0 mm Number of attempts: 1 Airway Equipment and Method: Stylet and Video-laryngoscopy Placement Confirmation: ETT inserted through vocal cords under direct vision and breath sounds checked- equal and bilateral Secured at: 22 cm Tube secured with: Tape Dental Injury: Teeth and Oropharynx as per pre-operative assessment

## 2017-08-09 NOTE — Anesthesia Postprocedure Evaluation (Signed)
Anesthesia Post Note  Patient: Kristine Blackburn  Procedure(s) Performed: LAPAROSCOPIC VENTRAL HERNIA REPAIR WITH MESH (N/A Abdomen)     Patient location during evaluation: PACU Anesthesia Type: General Level of consciousness: awake and alert Pain management: pain level controlled Vital Signs Assessment: post-procedure vital signs reviewed and stable Respiratory status: spontaneous breathing, nonlabored ventilation, respiratory function stable and patient connected to nasal cannula oxygen Cardiovascular status: blood pressure returned to baseline and stable Postop Assessment: no apparent nausea or vomiting Anesthetic complications: no    Last Vitals:  Vitals:   08/09/17 1128 08/09/17 1150  BP: 122/78 118/74  Pulse: 84 92  Resp: 13 16  Temp: 36.7 C 36.5 C  SpO2: 95% 91%    Last Pain:  Vitals:   08/09/17 1150  TempSrc: Oral  PainSc:                  [name]

## 2017-08-09 NOTE — Transfer of Care (Signed)
Immediate Anesthesia Transfer of Care Note  Patient: Kristine Blackburn  Procedure(s) Performed: LAPAROSCOPIC VENTRAL HERNIA REPAIR WITH MESH (N/A Abdomen)  Patient Location: PACU  Anesthesia Type:General  Level of Consciousness: awake, alert , oriented and patient cooperative  Airway & Oxygen Therapy: Patient Spontanous Breathing and Patient connected to nasal cannula oxygen  Post-op Assessment: Report given to RN, Post -op Vital signs reviewed and stable and Patient moving all extremities X 4  Post vital signs: Reviewed and stable  Last Vitals:  Vitals:   08/09/17 0703 08/09/17 1003  BP: (!) 157/71 128/71  Pulse: 88 80  Resp:  17  Temp: 36.7 C (!) 36.3 C  SpO2: 97% 98%    Last Pain:  Vitals:   08/09/17 1003  TempSrc:   PainSc: 8       Patients Stated Pain Goal: 3 (90/93/11 2162)  Complications: No apparent anesthesia complications

## 2017-08-09 NOTE — Anesthesia Preprocedure Evaluation (Signed)
Anesthesia Evaluation  Patient identified by MRN, date of birth, ID band Patient awake    Reviewed: Allergy & Precautions, NPO status , Patient's Chart, lab work & pertinent test results  Airway Mallampati: II  TM Distance: >3 FB Neck ROM: Full    Dental no notable dental hx.    Pulmonary neg pulmonary ROS,    Pulmonary exam normal breath sounds clear to auscultation       Cardiovascular hypertension, Pt. on medications Normal cardiovascular exam Rhythm:Regular Rate:Normal     Neuro/Psych negative neurological ROS  negative psych ROS   GI/Hepatic negative GI ROS, Neg liver ROS,   Endo/Other  Morbid obesity  Renal/GU negative Renal ROS  negative genitourinary   Musculoskeletal negative musculoskeletal ROS (+)   Abdominal   Peds negative pediatric ROS (+)  Hematology negative hematology ROS (+)   Anesthesia Other Findings   Reproductive/Obstetrics negative OB ROS                             Anesthesia Physical Anesthesia Plan  ASA: III  Anesthesia Plan: General   Post-op Pain Management:    Induction: Intravenous  PONV Risk Score and Plan: 4 or greater and Ondansetron, Dexamethasone, Midazolam and Scopolamine patch - Pre-op  Airway Management Planned: Oral ETT  Additional Equipment:   Intra-op Plan:   Post-operative Plan: Extubation in OR  Informed Consent: I have reviewed the patients History and Physical, chart, labs and discussed the procedure including the risks, benefits and alternatives for the proposed anesthesia with the patient or authorized representative who has indicated his/her understanding and acceptance.   Dental advisory given  Plan Discussed with: CRNA  Anesthesia Plan Comments:         Anesthesia Quick Evaluation

## 2017-08-09 NOTE — Interval H&P Note (Signed)
History and Physical Interval Note:no change in H and P  08/09/2017 7:08 AM  Kristine Blackburn  has presented today for surgery, with the diagnosis of VENTRAL HERNIA  The various methods of treatment have been discussed with the patient and family. After consideration of risks, benefits and other options for treatment, the patient has consented to  Procedure(s): [place] (N/A) as a surgical intervention .  The patient's history has been reviewed, patient examined, no change in status, stable for surgery.  I have reviewed the patient's chart and labs.  Questions were answered to the patient's satisfaction.     [name] A

## 2017-08-09 NOTE — Progress Notes (Signed)
Tolerated clear liquids well. Denies nausea.no vomitting.

## 2017-08-10 ENCOUNTER — Encounter (HOSPITAL_COMMUNITY): Payer: Self-pay | Admitting: Surgery

## 2017-08-10 DIAGNOSIS — I10 Essential (primary) hypertension: Secondary | ICD-10-CM | POA: Diagnosis not present

## 2017-08-10 DIAGNOSIS — K436 Other and unspecified ventral hernia with obstruction, without gangrene: Secondary | ICD-10-CM | POA: Diagnosis not present

## 2017-08-10 DIAGNOSIS — Z79899 Other long term (current) drug therapy: Secondary | ICD-10-CM | POA: Diagnosis not present

## 2017-08-10 MED ORDER — TRAMADOL HCL 50 MG PO TABS: 50.0000 mg | ORAL_TABLET | Freq: Four times a day (QID) | ORAL | 0 refills | Status: DC | PRN

## 2017-08-10 NOTE — Discharge Instructions (Signed)
[facility] _______[facility]  UMBILICAL OR INGUINAL HERNIA REPAIR: POST OP INSTRUCTIONS  Always review your discharge instruction sheet given to you by the facility where your surgery was performed. IF YOU HAVE DISABILITY OR FAMILY LEAVE FORMS, YOU MUST BRING THEM TO THE OFFICE FOR PROCESSING.   DO NOT GIVE THEM TO YOUR DOCTOR.  1. A  prescription for pain medication may be given to you upon discharge.  Take your pain medication as prescribed, if needed.  If narcotic pain medicine is not needed, then you may take acetaminophen (Tylenol) or ibuprofen (Advil) as needed. 2. Take your usually prescribed medications unless otherwise directed. If you need a refill on your pain medication, please contact your pharmacy.  They will contact our office to request authorization. Prescriptions will not be filled after 5 pm or on week-ends. 3. You should follow a light diet the first 24 hours after arrival home, such as soup and crackers, etc.  Be sure to include lots of fluids daily.  Resume your normal diet the day after surgery. 4.Most patients will experience some swelling and bruising around the umbilicus or in the groin and scrotum.  Ice packs and reclining will help.  Swelling and bruising can take several days to resolve.  6. It is common to experience some constipation if taking pain medication after surgery.  Increasing fluid intake and taking a stool softener (such as Colace) will usually help or prevent this problem from occurring.  A mild laxative (Milk of Magnesia or Miralax) should be taken according to package directions if there are no bowel movements after 48 hours. 7. Unless discharge instructions indicate otherwise, you may remove your bandages 24-48 hours after surgery, and you may shower at that time.  You may have steri-strips (small skin tapes) in place directly over the incision.  These strips should be left on the skin for 7-10 days.  If your surgeon used skin glue on the  incision, you may shower in 24 hours.  The glue will flake off over the next 2-3 weeks.  Any sutures or staples will be removed at the office during your follow-up visit. 8. ACTIVITIES:  You may resume regular (light) daily activities beginning the next day--such as daily self-care, walking, climbing stairs--gradually increasing activities as tolerated.  You may have sexual intercourse when it is comfortable.  Refrain from any heavy lifting or straining until approved by your doctor.  a.You may drive when you are no longer taking prescription pain medication, you can comfortably wear a seatbelt, and you can safely maneuver your car and apply brakes. b.RETURN TO WORK:   _____________________________________________  9.You should see your doctor in the office for a follow-up appointment approximately 2-3 weeks after your surgery.  Make sure that you call for this appointment within a day or two after you arrive home to insure a convenient appointment time. 10.OTHER INSTRUCTIONS: _OK TO SHOWER TODAY NO LIFTING MORE THAN 15 POUNDS FOR 6 WEEKS________________________    _____________________________________  WHEN TO CALL YOUR DOCTOR: 1. Fever over 101.0 2. Inability to urinate 3. Nausea and/or vomiting 4. Extreme swelling or bruising 5. Continued bleeding from incision. 6. Increased pain, redness, or drainage from the incision  The clinic staff is available to answer your questions during regular business hours.  Please dont hesitate to call and ask to speak to one of the nurses for clinical concerns.  If you have a medical emergency, go to the nearest emergency room or call 911.  A surgeon from  [facility] is always on call at the hospital   [address] [place], [place], [place]  [address] ?  [address], [place], [place]   [address] [phone] ? [phone] ? FAX [phone] Web site: [email or web address]

## 2017-08-10 NOTE — Progress Notes (Signed)
Patient ID: Kristine Blackburn, female   DOB: [birth year], 57 y.o.   MRN: [id number]  Doing well Pain controlled Abdomen soft Wants to go home  Plan: D/C

## 2017-08-10 NOTE — Discharge Summary (Signed)
Physician Discharge Summary  Patient ID: Kristine Blackburn MRN: [id number] DOB/AGE: 1961/01/31 57 y.o.  Admit date: 08/09/2017 Discharge date: 08/10/2017  Admission Diagnoses:  Discharge Diagnoses:  Active Problems:   Ventral hernia   Discharged Condition: good  Hospital Course: uneventful post op recovery. Discharge home POD#1  Consults: None  Significant Diagnostic Studies:   Treatments: surgery: laparoscopic ventral hernia repair with mesh  Discharge Exam: Blood pressure (!) 124/57, pulse 89, temperature 98.2 F (36.8 C), temperature source Oral, resp. rate 18, height 5\' 4"  (1.626 m), weight (!) 137.9 kg (304 lb), last menstrual period 01/27/2011, SpO2 94 %. General appearance: alert, cooperative and no distress Resp: clear to auscultation bilaterally Cardio: regular rate and rhythm, S1, S2 normal, no murmur, click, rub or gallop Incision/Wound: abdomen soft, NT  Disposition: 01-Home or Self Care    Follow-up Information    Coralie Keens, MD. Schedule an appointment as soon as possible for a visit in 3 week(s).   Specialty:  General Surgery Contact information: [address] [place] [address] [phone]           Signed: Harl Bowie 08/10/2017, 7:57 AM

## 2017-08-10 NOTE — Progress Notes (Signed)
Discharge home. Home discahrge instruction given, no question verbalized. 

## 2017-09-13 ENCOUNTER — Encounter (HOSPITAL_COMMUNITY): Payer: Self-pay | Admitting: Surgery

## 2017-10-19 ENCOUNTER — Ambulatory Visit (INDEPENDENT_AMBULATORY_CARE_PROVIDER_SITE_OTHER): Payer: Commercial Managed Care - PPO | Admitting: Family Medicine

## 2017-10-19 ENCOUNTER — Encounter: Payer: Self-pay | Admitting: Family Medicine

## 2017-10-19 VITALS — BP 128/70 | HR 76 | Temp 98.1°F | Resp 18 | Ht 64.0 in | Wt 305.0 lb

## 2017-10-19 DIAGNOSIS — S20211A Contusion of right front wall of thorax, initial encounter: Secondary | ICD-10-CM

## 2017-10-19 MED ORDER — TRAMADOL HCL 50 MG PO TABS: 50.0000 mg | ORAL_TABLET | Freq: Four times a day (QID) | ORAL | 0 refills | Status: DC | PRN
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
## 2017-11-18 ENCOUNTER — Other Ambulatory Visit: Payer: Self-pay | Admitting: Family Medicine

## 2017-12-07 ENCOUNTER — Other Ambulatory Visit: Payer: Self-pay | Admitting: Family Medicine

## 2018-01-27 ENCOUNTER — Encounter: Payer: Self-pay | Admitting: Family Medicine

## 2018-01-27 ENCOUNTER — Ambulatory Visit (INDEPENDENT_AMBULATORY_CARE_PROVIDER_SITE_OTHER): Payer: Commercial Managed Care - PPO | Admitting: Family Medicine

## 2018-01-27 VITALS — BP 132/80 | HR 70 | Temp 98.0°F | Resp 18 | Ht 64.0 in | Wt 304.0 lb

## 2018-01-27 DIAGNOSIS — I10 Essential (primary) hypertension: Secondary | ICD-10-CM | POA: Diagnosis not present

## 2018-01-27 DIAGNOSIS — F321 Major depressive disorder, single episode, moderate: Secondary | ICD-10-CM

## 2018-01-27 DIAGNOSIS — Z0000 Encounter for general adult medical examination without abnormal findings: Secondary | ICD-10-CM

## 2018-01-27 DIAGNOSIS — R7309 Other abnormal glucose: Secondary | ICD-10-CM | POA: Diagnosis not present
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
## 2018-01-29 LAB — TEST AUTHORIZATION

## 2018-01-29 LAB — CBC WITH DIFFERENTIAL/PLATELET
Basophils Absolute: 44 cells/uL (ref 0–200)
Basophils Relative: 0.5 %
Eosinophils Absolute: 70 cells/uL (ref 15–500)
Eosinophils Relative: 0.8 %
HCT: 40.6 % (ref 35.0–45.0)
Hemoglobin: 13.1 g/dL (ref 11.7–15.5)
Lymphs Abs: 2974 cells/uL (ref 850–3900)
MCH: 28.2 pg (ref 27.0–33.0)
MCHC: 32.3 g/dL (ref 32.0–36.0)
MCV: 87.5 fL (ref 80.0–100.0)
MPV: 11.5 fL (ref 7.5–12.5)
Monocytes Relative: 6.9 %
Neutro Abs: 5104 cells/uL (ref 1500–7800)
Neutrophils Relative %: 58 %
Platelets: 270 10*3/uL (ref 140–400)
RBC: 4.64 10*6/uL (ref 3.80–5.10)
RDW: 14.9 % (ref 11.0–15.0)
Total Lymphocyte: 33.8 %
WBC mixed population: 607 cells/uL (ref 200–950)
WBC: 8.8 10*3/uL (ref 3.8–10.8)

## 2018-01-29 LAB — COMPLETE METABOLIC PANEL WITH GFR
AG Ratio: 1.1 (calc) (ref 1.0–2.5)
ALT: 15 U/L (ref 6–29)
AST: 13 U/L (ref 10–35)
Albumin: 3.4 g/dL — ABNORMAL LOW (ref 3.6–5.1)
Alkaline phosphatase (APISO): 70 U/L (ref 33–130)
BUN: 14 mg/dL (ref 7–25)
CO2: 31 mmol/L (ref 20–32)
Calcium: 8.8 mg/dL (ref 8.6–10.4)
Chloride: 100 mmol/L (ref 98–110)
Creat: 0.81 mg/dL (ref 0.50–1.05)
GFR, Est African American: 93 mL/min/{1.73_m2} (ref >=60)
GFR, Est Non African American: 81 mL/min/{1.73_m2} (ref >=60)
Globulin: 3.2 g/dL (calc) (ref 1.9–3.7)
Glucose, Bld: 130 mg/dL — ABNORMAL HIGH (ref 65–99)
Potassium: 4.1 mmol/L (ref 3.5–5.3)
Sodium: 139 mmol/L (ref 135–146)
Total Bilirubin: 0.4 mg/dL (ref 0.2–1.2)
Total Protein: 6.6 g/dL (ref 6.1–8.1)

## 2018-01-29 LAB — LIPID PANEL
Cholesterol: 168 mg/dL (ref <200)
HDL: 42 mg/dL — ABNORMAL LOW (ref >50)
LDL Cholesterol (Calc): 98 mg/dL (calc)
Non-HDL Cholesterol (Calc): 126 mg/dL (calc) (ref <130)
Total CHOL/HDL Ratio: 4 (calc) (ref <5.0)
Triglycerides: 179 mg/dL — ABNORMAL HIGH (ref <150)

## 2018-01-29 LAB — HEMOGLOBIN A1C W/OUT EAG: Hgb A1c MFr Bld: 7.2 % of total Hgb — ABNORMAL HIGH (ref <5.7)

## 2018-01-31 ENCOUNTER — Encounter: Payer: Self-pay | Admitting: Family Medicine

## 2018-01-31 ENCOUNTER — Other Ambulatory Visit: Payer: Self-pay | Admitting: Family Medicine

## 2018-01-31 MED ORDER — ATORVASTATIN CALCIUM 20 MG PO TABS: 20.0000 mg | ORAL_TABLET | Freq: Every day | ORAL | 1 refills | Status: DC

## 2018-01-31 MED ORDER — METFORMIN HCL 500 MG PO TABS: 500.0000 mg | ORAL_TABLET | Freq: Two times a day (BID) | ORAL | 1 refills | Status: DC

## 2018-02-08 ENCOUNTER — Telehealth: Payer: Self-pay | Admitting: Family Medicine

## 2018-02-08 ENCOUNTER — Ambulatory Visit (INDEPENDENT_AMBULATORY_CARE_PROVIDER_SITE_OTHER): Payer: Commercial Managed Care - PPO | Admitting: Family Medicine

## 2018-02-08 ENCOUNTER — Encounter: Payer: Self-pay | Admitting: Family Medicine

## 2018-02-08 VITALS — BP 143/75 | HR 84 | Ht 63.5 in | Wt 304.0 lb

## 2018-02-08 DIAGNOSIS — Z1151 Encounter for screening for human papillomavirus (HPV): Secondary | ICD-10-CM

## 2018-02-08 DIAGNOSIS — Z8041 Family history of malignant neoplasm of ovary: Secondary | ICD-10-CM | POA: Insufficient documentation

## 2018-02-08 DIAGNOSIS — Z01419 Encounter for gynecological examination (general) (routine) without abnormal findings: Secondary | ICD-10-CM

## 2018-02-08 DIAGNOSIS — Z124 Encounter for screening for malignant neoplasm of cervix: Secondary | ICD-10-CM | POA: Diagnosis not present
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
## 2018-02-08 NOTE — Telephone Encounter (Signed)
[facility] forms dropped off need to be completed before 02/13/2018 and faxed to number on sheet also please call pt so she can pick up a copy for her records. Placed into yellow folder

## 2018-02-08 NOTE — Assessment & Plan Note (Signed)
Consider BRCA testing--we did not have any kits which were not expired today--consider at next visit

## 2018-02-08 NOTE — Patient Instructions (Signed)
Preventive Care 40-64 Years, Female Preventive care refers to lifestyle choices and visits with your health care provider that can promote health and wellness. What does preventive care include?  A yearly physical exam. This is also called an annual well check.  Dental exams once or twice a year.  Routine eye exams. Ask your health care provider how often you should have your eyes checked.  Personal lifestyle choices, including: ? Daily care of your teeth and gums. ? Regular physical activity. ? Eating a healthy diet. ? Avoiding tobacco and drug use. ? Limiting alcohol use. ? Practicing safe sex. ? Taking low-dose aspirin daily starting at age 28. ? Taking vitamin and mineral supplements as recommended by your health care provider. What happens during an annual well check? The services and screenings done by your health care provider during your annual well check will depend on your age, overall health, lifestyle risk factors, and family history of disease. Counseling Your health care provider may ask you questions about your:  Alcohol use.  Tobacco use.  Drug use.  Emotional well-being.  Home and relationship well-being.  Sexual activity.  Eating habits.  Work and work Statistician.  Method of birth control.  Menstrual cycle.  Pregnancy history.  Screening You may have the following tests or measurements:  Height, weight, and BMI.  Blood pressure.  Lipid and cholesterol levels. These may be checked every 5 years, or more frequently if you are over 68 years old.  Skin check.  Lung cancer screening. You may have this screening every year starting at age 32 if you have a 30-pack-year history of smoking and currently smoke or have quit within the past 15 years.  Fecal occult blood test (FOBT) of the stool. You may have this test every year starting at age 35.  Flexible sigmoidoscopy or colonoscopy. You may have a sigmoidoscopy every 5 years or a colonoscopy  every 10 years starting at age 31.  Hepatitis C blood test.  Hepatitis B blood test.  Sexually transmitted disease (STD) testing.  Diabetes screening. This is done by checking your blood sugar (glucose) after you have not eaten for a while (fasting). You may have this done every 1-3 years.  Mammogram. This may be done every 1-2 years. Talk to your health care provider about when you should start having regular mammograms. This may depend on whether you have a family history of breast cancer.  BRCA-related cancer screening. This may be done if you have a family history of breast, ovarian, tubal, or peritoneal cancers.  Pelvic exam and Pap test. This may be done every 3 years starting at age 82. Starting at age 18, this may be done every 5 years if you have a Pap test in combination with an HPV test.  Bone density scan. This is done to screen for osteoporosis. You may have this scan if you are at high risk for osteoporosis.  Discuss your test results, treatment options, and if necessary, the need for more tests with your health care provider. Vaccines Your health care provider may recommend certain vaccines, such as:  Influenza vaccine. This is recommended every year.  Tetanus, diphtheria, and acellular pertussis (Tdap, Td) vaccine. You may need a Td booster every 10 years.  Varicella vaccine. You may need this if you have not been vaccinated.  Zoster vaccine. You may need this after age 53.  Measles, mumps, and rubella (MMR) vaccine. You may need at least one dose of MMR if you were born in  1957 or later. You may also need a second dose.  Pneumococcal 13-valent conjugate (PCV13) vaccine. You may need this if you have certain conditions and were not previously vaccinated.  Pneumococcal polysaccharide (PPSV23) vaccine. You may need one or two doses if you smoke cigarettes or if you have certain conditions.  Meningococcal vaccine. You may need this if you have certain  conditions.  Hepatitis A vaccine. You may need this if you have certain conditions or if you travel or work in places where you may be exposed to hepatitis A.  Hepatitis B vaccine. You may need this if you have certain conditions or if you travel or work in places where you may be exposed to hepatitis B.  Haemophilus influenzae type b (Hib) vaccine. You may need this if you have certain conditions.  Talk to your health care provider about which screenings and vaccines you need and how often you need them. This information is not intended to replace advice given to you by your health care provider. Make sure you discuss any questions you have with your health care provider. Document Released: 06/28/2015 Document Revised: 02/19/2016 Document Reviewed: 04/02/2015 Elsevier Interactive Patient Education  2018 Elsevier Inc.  

## 2018-02-09 NOTE — Telephone Encounter (Signed)
Filled out form and placed in Dr. Samella Parr blue folder

## 2018-02-10 LAB — CYTOLOGY - PAP
Diagnosis: NEGATIVE
HPV: NOT DETECTED

## 2018-02-10 NOTE — Telephone Encounter (Signed)
Formes completed and faxed back to [employer] at [phone]

## 2018-02-10 NOTE — Telephone Encounter (Signed)
LMOVM that forms are ready for pick up

## 2018-04-01 ENCOUNTER — Other Ambulatory Visit: Payer: Self-pay | Admitting: Family Medicine

## 2018-04-01 DIAGNOSIS — Z1231 Encounter for screening mammogram for malignant neoplasm of breast: Secondary | ICD-10-CM

## 2018-04-04 DIAGNOSIS — R222 Localized swelling, mass and lump, trunk: Secondary | ICD-10-CM | POA: Diagnosis not present

## 2018-04-04 DIAGNOSIS — Z09 Encounter for follow-up examination after completed treatment for conditions other than malignant neoplasm: Secondary | ICD-10-CM | POA: Diagnosis not present

## 2018-04-07 ENCOUNTER — Other Ambulatory Visit: Payer: Self-pay | Admitting: Student

## 2018-04-07 DIAGNOSIS — R222 Localized swelling, mass and lump, trunk: Secondary | ICD-10-CM

## 2018-04-12 ENCOUNTER — Ambulatory Visit
Admission: RE | Admit: 2018-04-12 | Discharge: 2018-04-12 | Disposition: A | Discharge status: Home / Self Care | Payer: Commercial Managed Care - PPO | Source: Ambulatory Visit | Attending: Student | Admitting: Student

## 2018-04-12 DIAGNOSIS — R222 Localized swelling, mass and lump, trunk: Secondary | ICD-10-CM

## 2018-04-12 DIAGNOSIS — K760 Fatty (change of) liver, not elsewhere classified: Secondary | ICD-10-CM | POA: Diagnosis not present

## 2018-04-12 DIAGNOSIS — K429 Umbilical hernia without obstruction or gangrene: Secondary | ICD-10-CM | POA: Diagnosis not present

## 2018-04-12 MED ORDER — IOPAMIDOL (ISOVUE-300) INJECTION 61%
100.0000 mL | Freq: Once | INTRAVENOUS | Status: AC | PRN
  Administered 2018-04-12: 125 mL via INTRAVENOUS

## 2018-04-21 ENCOUNTER — Other Ambulatory Visit: Payer: Self-pay | Admitting: Surgery

## 2018-04-21 DIAGNOSIS — S301XXA Contusion of abdominal wall, initial encounter: Secondary | ICD-10-CM | POA: Diagnosis not present

## 2018-05-06 ENCOUNTER — Ambulatory Visit: Payer: Commercial Managed Care - PPO

## 2018-06-17 ENCOUNTER — Ambulatory Visit
Admission: RE | Admit: 2018-06-17 | Discharge: 2018-06-17 | Disposition: A | Discharge status: Home / Self Care | Payer: Commercial Managed Care - PPO | Source: Ambulatory Visit | Attending: Family Medicine | Admitting: Family Medicine

## 2018-06-17 DIAGNOSIS — Z1231 Encounter for screening mammogram for malignant neoplasm of breast: Secondary | ICD-10-CM | POA: Diagnosis not present

## 2018-07-18 DIAGNOSIS — R319 Hematuria, unspecified: Secondary | ICD-10-CM | POA: Diagnosis not present

## 2018-07-27 ENCOUNTER — Other Ambulatory Visit: Payer: Self-pay | Admitting: Family Medicine

## 2018-08-12 DIAGNOSIS — S301XXA Contusion of abdominal wall, initial encounter: Secondary | ICD-10-CM | POA: Diagnosis not present

## 2018-08-20 DIAGNOSIS — K0889 Other specified disorders of teeth and supporting structures: Secondary | ICD-10-CM | POA: Diagnosis not present

## 2018-09-02 ENCOUNTER — Other Ambulatory Visit: Payer: Self-pay | Admitting: Family Medicine

## 2018-09-02 MED ORDER — ESCITALOPRAM OXALATE 10 MG PO TABS: 10.0000 mg | ORAL_TABLET | Freq: Every day | ORAL | 3 refills | Status: DC

## 2018-09-19 ENCOUNTER — Other Ambulatory Visit: Payer: Self-pay

## 2018-09-19 MED ORDER — LISINOPRIL-HYDROCHLOROTHIAZIDE 20-12.5 MG PO TABS: 1.0000 | ORAL_TABLET | Freq: Every day | ORAL | 0 refills | Status: DC

## 2018-10-12 ENCOUNTER — Telehealth: Payer: Self-pay | Admitting: Family Medicine

## 2018-10-12 MED ORDER — LISINOPRIL-HYDROCHLOROTHIAZIDE 20-12.5 MG PO TABS: 1.0000 | ORAL_TABLET | Freq: Every day | ORAL | 0 refills | Status: DC

## 2018-10-12 NOTE — Telephone Encounter (Signed)
Patient is calling to get refill on bp medication  [facility]

## 2018-10-12 NOTE — Telephone Encounter (Signed)
Medication called/sent to requested pharmacy  

## 2018-10-14 DIAGNOSIS — L7634 Postprocedural seroma of skin and subcutaneous tissue following other procedure: Secondary | ICD-10-CM | POA: Diagnosis not present

## 2018-10-20 ENCOUNTER — Other Ambulatory Visit: Payer: Self-pay | Admitting: Family Medicine

## 2018-11-01 ENCOUNTER — Other Ambulatory Visit: Payer: Self-pay

## 2018-11-01 ENCOUNTER — Ambulatory Visit: Payer: Commercial Managed Care - PPO | Admitting: Family Medicine

## 2018-11-01 ENCOUNTER — Encounter: Payer: Self-pay | Admitting: Family Medicine

## 2018-11-01 VITALS — BP 138/74 | HR 78 | Temp 98.6°F | Resp 16 | Ht 64.0 in | Wt 310.0 lb

## 2018-11-01 DIAGNOSIS — L03114 Cellulitis of left upper limb: Secondary | ICD-10-CM

## 2018-11-01 MED ORDER — DOXYCYCLINE HYCLATE 100 MG PO TABS: 100.0000 mg | ORAL_TABLET | Freq: Two times a day (BID) | ORAL | 0 refills | Status: DC
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
## 2019-01-28 ENCOUNTER — Other Ambulatory Visit: Payer: Self-pay | Admitting: Family Medicine

## 2019-02-21 ENCOUNTER — Other Ambulatory Visit: Payer: Self-pay | Admitting: Surgery

## 2019-03-28 ENCOUNTER — Other Ambulatory Visit: Payer: Self-pay | Admitting: Surgery

## 2019-03-30 ENCOUNTER — Other Ambulatory Visit: Payer: Self-pay

## 2019-03-30 ENCOUNTER — Ambulatory Visit (INDEPENDENT_AMBULATORY_CARE_PROVIDER_SITE_OTHER): Payer: Commercial Managed Care - PPO | Admitting: Family Medicine

## 2019-03-30 VITALS — BP 140/84 | HR 94 | Temp 98.1°F | Resp 18 | Ht 64.0 in | Wt 307.0 lb

## 2019-03-30 DIAGNOSIS — E118 Type 2 diabetes mellitus with unspecified complications: Secondary | ICD-10-CM | POA: Diagnosis not present

## 2019-03-30 DIAGNOSIS — I10 Essential (primary) hypertension: Secondary | ICD-10-CM | POA: Diagnosis not present

## 2019-03-30 MED ORDER — LISINOPRIL-HYDROCHLOROTHIAZIDE 20-12.5 MG PO TABS: 1.0000 | ORAL_TABLET | Freq: Every day | ORAL | 0 refills | Status: DC
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
## 2019-03-31 ENCOUNTER — Other Ambulatory Visit: Payer: Self-pay

## 2019-03-31 LAB — COMPLETE METABOLIC PANEL WITH GFR
AG Ratio: 1.1 (calc) (ref 1.0–2.5)
ALT: 18 U/L (ref 6–29)
AST: 13 U/L (ref 10–35)
Albumin: 3.4 g/dL — ABNORMAL LOW (ref 3.6–5.1)
Alkaline phosphatase (APISO): 75 U/L (ref 37–153)
BUN: 10 mg/dL (ref 7–25)
CO2: 32 mmol/L (ref 20–32)
Calcium: 8.4 mg/dL — ABNORMAL LOW (ref 8.6–10.4)
Chloride: 103 mmol/L (ref 98–110)
Creat: 0.8 mg/dL (ref 0.50–1.05)
GFR, Est African American: 94 mL/min/{1.73_m2} (ref >=60)
GFR, Est Non African American: 81 mL/min/{1.73_m2} (ref >=60)
Globulin: 3.2 g/dL (calc) (ref 1.9–3.7)
Glucose, Bld: 225 mg/dL — ABNORMAL HIGH (ref 65–99)
Potassium: 4 mmol/L (ref 3.5–5.3)
Sodium: 141 mmol/L (ref 135–146)
Total Bilirubin: 0.6 mg/dL (ref 0.2–1.2)
Total Protein: 6.6 g/dL (ref 6.1–8.1)

## 2019-03-31 LAB — CBC WITH DIFFERENTIAL/PLATELET
Absolute Monocytes: 525 cells/uL (ref 200–950)
Basophils Absolute: 17 cells/uL (ref 0–200)
Basophils Relative: 0.2 %
Eosinophils Absolute: 52 cells/uL (ref 15–500)
Eosinophils Relative: 0.6 %
HCT: 42.4 % (ref 35.0–45.0)
Hemoglobin: 13.1 g/dL (ref 11.7–15.5)
Lymphs Abs: 2675 cells/uL (ref 850–3900)
MCH: 27.6 pg (ref 27.0–33.0)
MCHC: 30.9 g/dL — ABNORMAL LOW (ref 32.0–36.0)
MCV: 89.3 fL (ref 80.0–100.0)
MPV: 11.2 fL (ref 7.5–12.5)
Monocytes Relative: 6.1 %
Neutro Abs: 5332 cells/uL (ref 1500–7800)
Neutrophils Relative %: 62 %
Platelets: 234 10*3/uL (ref 140–400)
RBC: 4.75 10*6/uL (ref 3.80–5.10)
RDW: 14.6 % (ref 11.0–15.0)
Total Lymphocyte: 31.1 %
WBC: 8.6 10*3/uL (ref 3.8–10.8)

## 2019-03-31 LAB — LIPID PANEL
Cholesterol: 129 mg/dL (ref <200)
HDL: 40 mg/dL — ABNORMAL LOW (ref >=50)
LDL Cholesterol (Calc): 62 mg/dL (calc)
Non-HDL Cholesterol (Calc): 89 mg/dL (calc) (ref <130)
Total CHOL/HDL Ratio: 3.2 (calc) (ref <5.0)
Triglycerides: 196 mg/dL — ABNORMAL HIGH (ref <150)

## 2019-03-31 LAB — HEMOGLOBIN A1C
Hgb A1c MFr Bld: 8.3 % of total Hgb — ABNORMAL HIGH (ref <5.7)
Mean Plasma Glucose: 192 (calc)
eAG (mmol/L): 10.6 (calc)

## 2019-03-31 MED ORDER — SITAGLIPTIN PHOSPHATE 100 MG PO TABS: 100.0000 mg | ORAL_TABLET | Freq: Every day | ORAL | 2 refills | Status: DC

## 2019-04-11 ENCOUNTER — Other Ambulatory Visit: Payer: Self-pay | Admitting: Family Medicine

## 2019-04-12 ENCOUNTER — Other Ambulatory Visit: Payer: Self-pay | Admitting: Family Medicine

## 2019-04-12 DIAGNOSIS — Z1231 Encounter for screening mammogram for malignant neoplasm of breast: Secondary | ICD-10-CM

## 2019-04-20 ENCOUNTER — Encounter (HOSPITAL_COMMUNITY): Payer: Commercial Managed Care - PPO

## 2019-04-22 ENCOUNTER — Other Ambulatory Visit (HOSPITAL_COMMUNITY): Payer: Commercial Managed Care - PPO

## 2019-04-25 ENCOUNTER — Other Ambulatory Visit: Payer: Self-pay | Admitting: Family Medicine

## 2019-04-26 ENCOUNTER — Encounter (HOSPITAL_COMMUNITY): Admission: RE | Payer: Self-pay | Source: Home / Self Care

## 2019-04-26 ENCOUNTER — Ambulatory Visit (HOSPITAL_COMMUNITY): Admission: RE | Admit: 2019-04-26 | Payer: Commercial Managed Care - PPO | Source: Home / Self Care | Admitting: Surgery

## 2019-04-26 SURGERY — EXCISION, KELOID
Anesthesia: General

## 2019-06-20 ENCOUNTER — Ambulatory Visit
Admission: RE | Admit: 2019-06-20 | Discharge: 2019-06-20 | Disposition: A | Discharge status: Home / Self Care | Payer: Commercial Managed Care - PPO | Source: Ambulatory Visit | Attending: Family Medicine | Admitting: Family Medicine

## 2019-06-20 ENCOUNTER — Other Ambulatory Visit: Payer: Self-pay

## 2019-06-20 DIAGNOSIS — Z1231 Encounter for screening mammogram for malignant neoplasm of breast: Secondary | ICD-10-CM

## 2019-06-21 ENCOUNTER — Other Ambulatory Visit: Payer: Self-pay | Admitting: Family Medicine

## 2019-06-22 ENCOUNTER — Other Ambulatory Visit: Payer: Self-pay | Admitting: Family Medicine

## 2019-08-25 ENCOUNTER — Other Ambulatory Visit: Payer: Self-pay | Admitting: Family Medicine

## 2019-09-13 ENCOUNTER — Other Ambulatory Visit: Payer: Self-pay | Admitting: Family Medicine

## 2019-10-30 ENCOUNTER — Other Ambulatory Visit: Payer: Self-pay | Admitting: Family Medicine

## 2019-10-30 MED ORDER — ATORVASTATIN CALCIUM 20 MG PO TABS: 20.0000 mg | ORAL_TABLET | Freq: Every day | ORAL | 1 refills | Status: DC

## 2019-12-15 ENCOUNTER — Other Ambulatory Visit: Payer: Self-pay | Admitting: Family Medicine

## 2020-01-06 ENCOUNTER — Other Ambulatory Visit: Payer: Self-pay | Admitting: Family Medicine

## 2020-01-15 ENCOUNTER — Other Ambulatory Visit: Payer: Self-pay

## 2020-01-15 ENCOUNTER — Other Ambulatory Visit: Payer: Commercial Managed Care - PPO

## 2020-01-15 DIAGNOSIS — I10 Essential (primary) hypertension: Secondary | ICD-10-CM

## 2020-01-15 DIAGNOSIS — E7800 Pure hypercholesterolemia, unspecified: Secondary | ICD-10-CM

## 2020-01-15 DIAGNOSIS — E118 Type 2 diabetes mellitus with unspecified complications: Secondary | ICD-10-CM

## 2020-01-16 LAB — HEMOGLOBIN A1C
Hgb A1c MFr Bld: 6.8 % of total Hgb — ABNORMAL HIGH (ref <5.7)
Mean Plasma Glucose: 148 (calc)
eAG (mmol/L): 8.2 (calc)

## 2020-01-16 LAB — LIPID PANEL
Cholesterol: 137 mg/dL (ref <200)
HDL: 43 mg/dL — ABNORMAL LOW (ref >=50)
LDL Cholesterol (Calc): 67 mg/dL (calc)
Non-HDL Cholesterol (Calc): 94 mg/dL (calc) (ref <130)
Total CHOL/HDL Ratio: 3.2 (calc) (ref <5.0)
Triglycerides: 200 mg/dL — ABNORMAL HIGH (ref <150)

## 2020-01-16 LAB — COMPLETE METABOLIC PANEL WITH GFR
AG Ratio: 1.2 (calc) (ref 1.0–2.5)
ALT: 15 U/L (ref 6–29)
AST: 13 U/L (ref 10–35)
Albumin: 3.4 g/dL — ABNORMAL LOW (ref 3.6–5.1)
Alkaline phosphatase (APISO): 79 U/L (ref 37–153)
BUN: 14 mg/dL (ref 7–25)
CO2: 31 mmol/L (ref 20–32)
Calcium: 9 mg/dL (ref 8.6–10.4)
Chloride: 101 mmol/L (ref 98–110)
Creat: 0.88 mg/dL (ref 0.50–1.05)
GFR, Est African American: 83 mL/min/{1.73_m2} (ref >=60)
GFR, Est Non African American: 72 mL/min/{1.73_m2} (ref >=60)
Globulin: 2.9 g/dL (calc) (ref 1.9–3.7)
Glucose, Bld: 208 mg/dL — ABNORMAL HIGH (ref 65–99)
Potassium: 3.8 mmol/L (ref 3.5–5.3)
Sodium: 139 mmol/L (ref 135–146)
Total Bilirubin: 0.4 mg/dL (ref 0.2–1.2)
Total Protein: 6.3 g/dL (ref 6.1–8.1)

## 2020-01-16 LAB — CBC WITH DIFFERENTIAL/PLATELET
Absolute Monocytes: 525 cells/uL (ref 200–950)
Basophils Absolute: 43 cells/uL (ref 0–200)
Basophils Relative: 0.5 %
Eosinophils Absolute: 69 cells/uL (ref 15–500)
Eosinophils Relative: 0.8 %
HCT: 41.1 % (ref 35.0–45.0)
Hemoglobin: 12.9 g/dL (ref 11.7–15.5)
Lymphs Abs: 2804 cells/uL (ref 850–3900)
MCH: 28 pg (ref 27.0–33.0)
MCHC: 31.4 g/dL — ABNORMAL LOW (ref 32.0–36.0)
MCV: 89.3 fL (ref 80.0–100.0)
MPV: 11.3 fL (ref 7.5–12.5)
Monocytes Relative: 6.1 %
Neutro Abs: 5160 cells/uL (ref 1500–7800)
Neutrophils Relative %: 60 %
Platelets: 233 10*3/uL (ref 140–400)
RBC: 4.6 10*6/uL (ref 3.80–5.10)
RDW: 15.3 % — ABNORMAL HIGH (ref 11.0–15.0)
Total Lymphocyte: 32.6 %
WBC: 8.6 10*3/uL (ref 3.8–10.8)

## 2020-01-24 ENCOUNTER — Other Ambulatory Visit: Payer: Self-pay | Admitting: Family Medicine

## 2020-01-29 ENCOUNTER — Other Ambulatory Visit: Payer: Self-pay

## 2020-01-29 ENCOUNTER — Ambulatory Visit: Payer: Commercial Managed Care - PPO | Admitting: Family Medicine

## 2020-01-29 VITALS — BP 110/68 | HR 82 | Temp 95.7°F | Ht 64.0 in | Wt 288.0 lb

## 2020-01-29 DIAGNOSIS — I10 Essential (primary) hypertension: Secondary | ICD-10-CM | POA: Diagnosis not present

## 2020-01-29 DIAGNOSIS — E785 Hyperlipidemia, unspecified: Secondary | ICD-10-CM | POA: Diagnosis not present

## 2020-01-29 DIAGNOSIS — E118 Type 2 diabetes mellitus with unspecified complications: Secondary | ICD-10-CM | POA: Diagnosis not present

## 2020-01-29 MED ORDER — SITAGLIPTIN PHOSPHATE 100 MG PO TABS: 100.0000 mg | ORAL_TABLET | Freq: Every day | ORAL | 3 refills | Status: DC
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
## 2020-02-05 IMAGING — MG DIGITAL SCREENING BILATERAL MAMMOGRAM WITH CAD
6 series · 6 of 6 positions shown · non-contrast
Comparison: Previous exam(s).

ACR Breast Density Category a: The breast tissue is almost entirely
fatty.

CLINICAL DATA: Screening.

EXAM:
DIGITAL SCREENING BILATERAL MAMMOGRAM WITH CAD

[R MLO (1 of 2)]
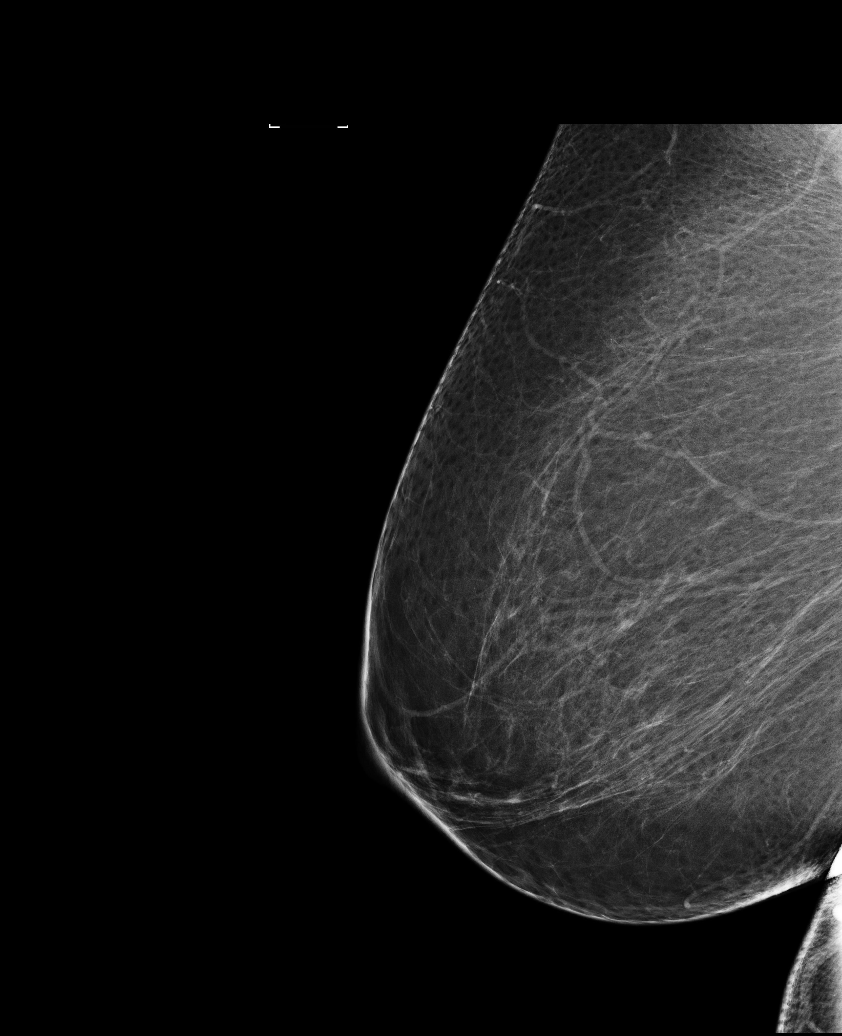

[R CC]
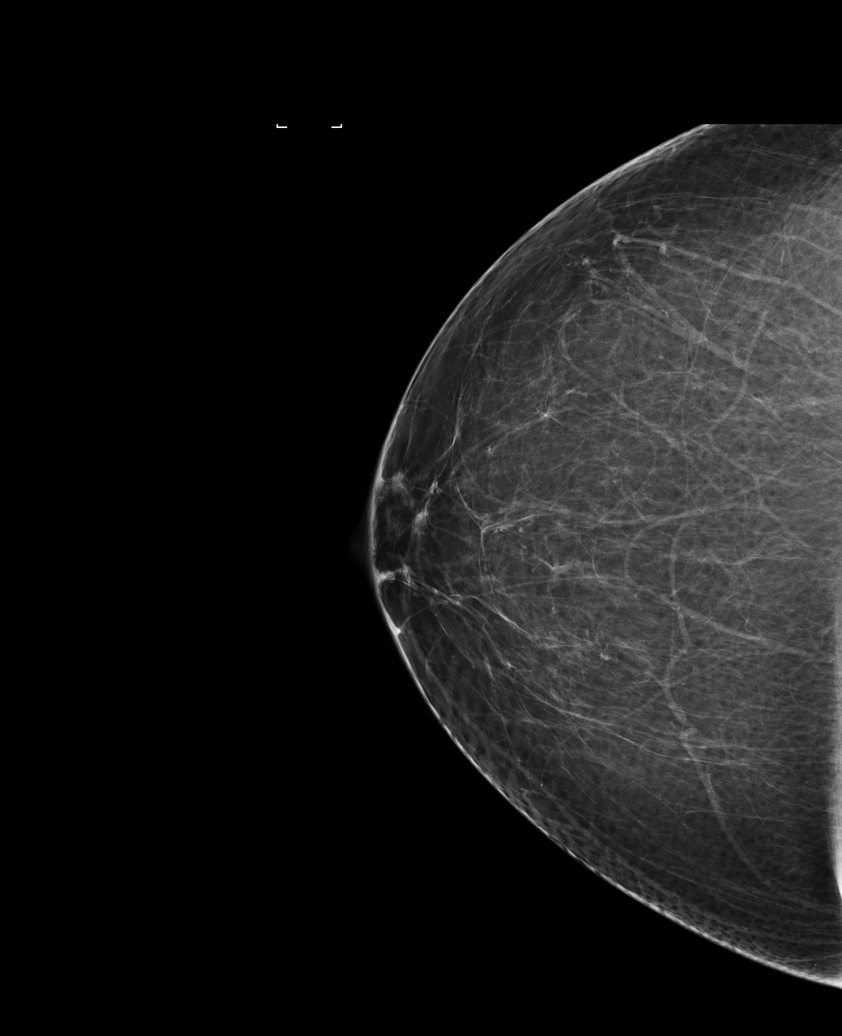

[R MLO (2 of 2)]
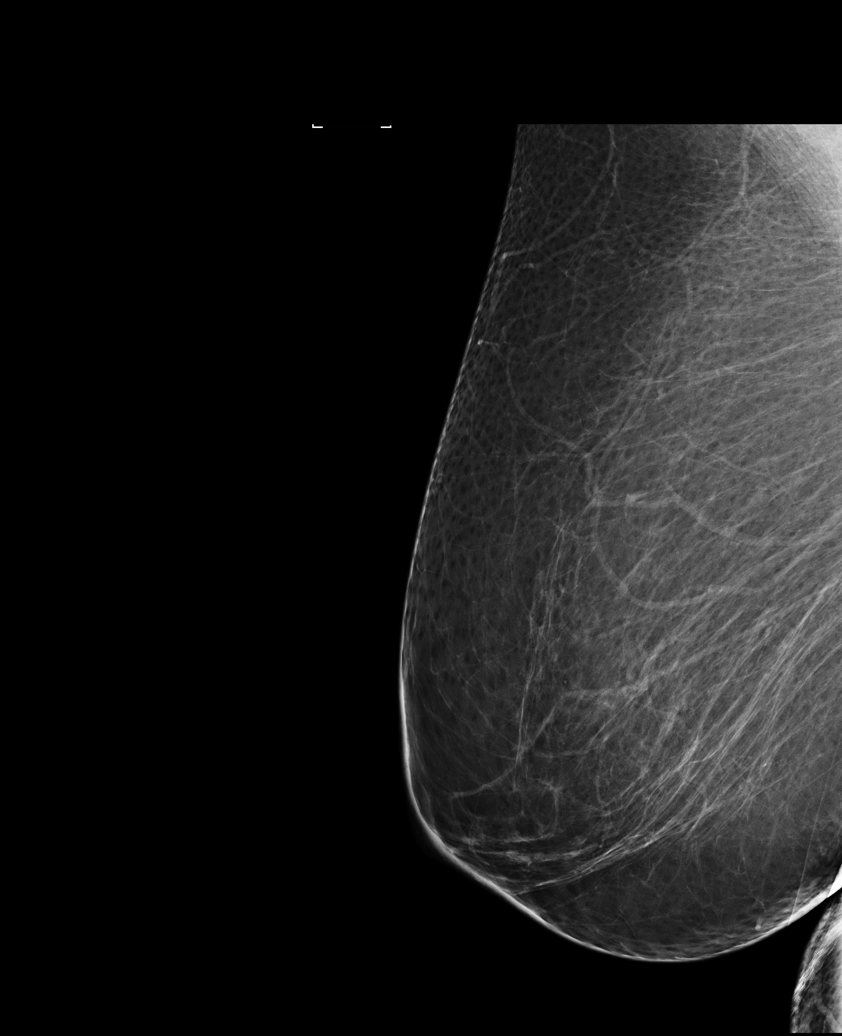

[L CC]
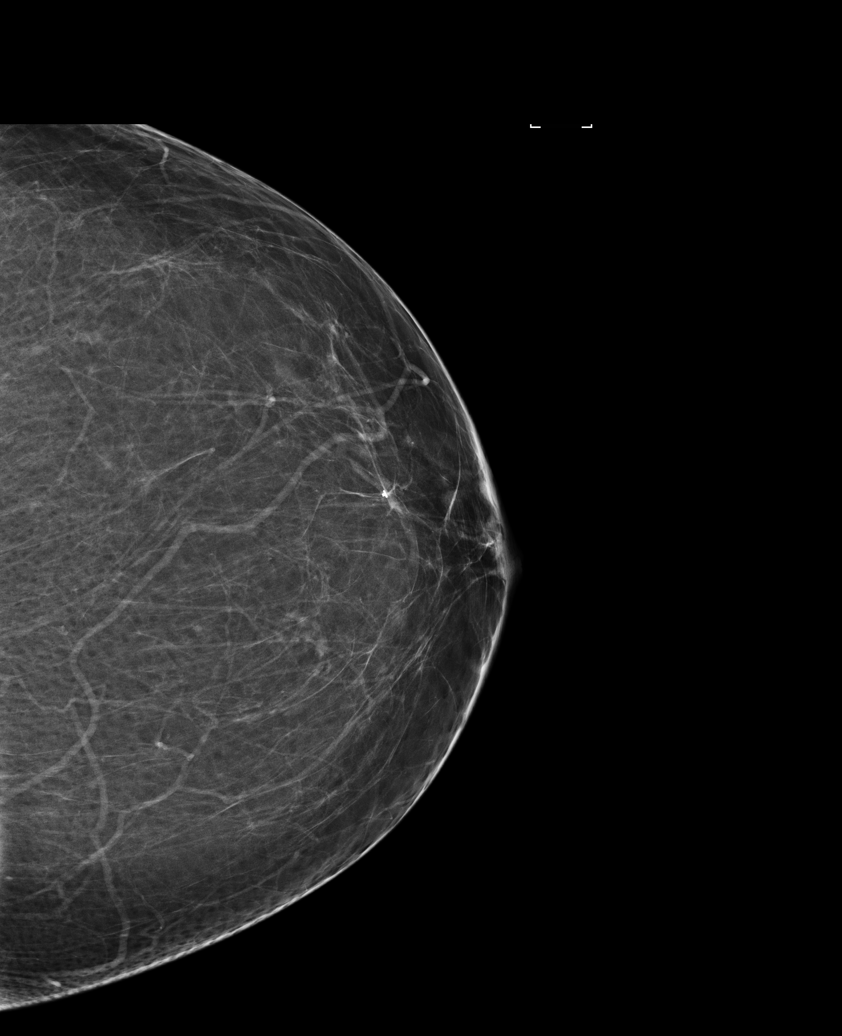

[L MLO (1 of 2)]
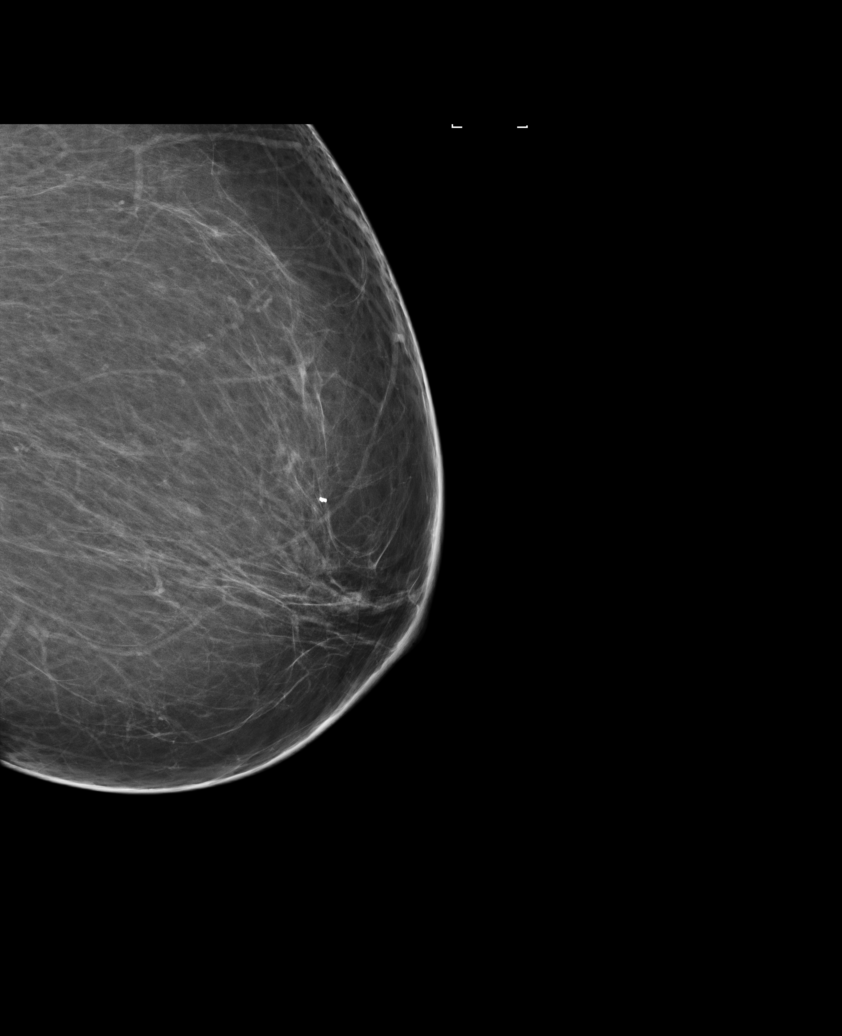

[L MLO (2 of 2)]
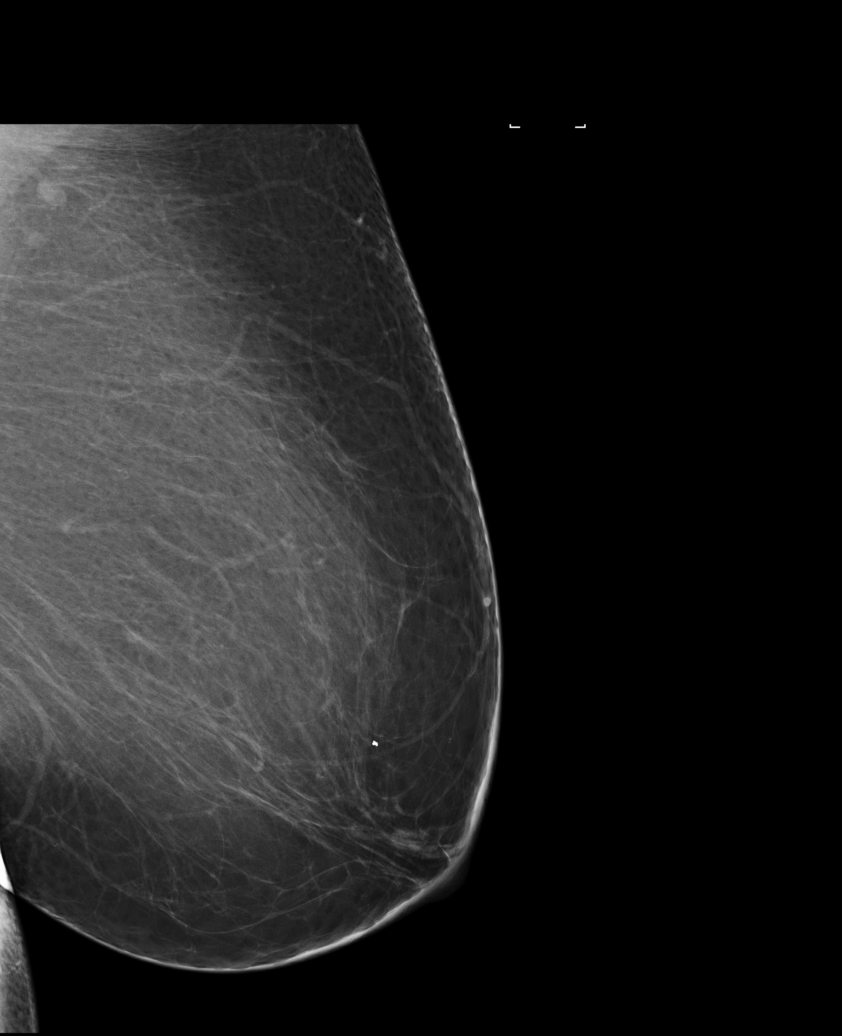

[6 of 6 positions shown; findings below may reference images not displayed]

FINDINGS: There are no findings suspicious for malignancy. Images were
processed with CAD.
IMPRESSION: No mammographic evidence of malignancy. A result letter of this
screening mammogram will be mailed directly to the patient.

RECOMMENDATION:
Screening mammogram in one year. (Code:[id number]-W-[id number])

BI-RADS CATEGORY  1: Negative.

## 2020-02-08 ENCOUNTER — Other Ambulatory Visit: Payer: Self-pay | Admitting: Family Medicine

## 2020-02-13 ENCOUNTER — Other Ambulatory Visit: Payer: Self-pay

## 2020-02-13 MED ORDER — METFORMIN HCL 500 MG PO TABS: ORAL_TABLET | ORAL | 0 refills | Status: DC

## 2020-03-11 ENCOUNTER — Other Ambulatory Visit: Payer: Self-pay | Admitting: *Deleted

## 2020-03-11 MED ORDER — ESCITALOPRAM OXALATE 10 MG PO TABS
10.0000 mg | ORAL_TABLET | Freq: Every day | ORAL | 3 refills | Status: DC
Start: 2020-03-11 — End: 2020-07-02

## 2020-04-04 LAB — HM DIABETES EYE EXAM

## 2020-05-12 ENCOUNTER — Other Ambulatory Visit: Payer: Self-pay | Admitting: Family Medicine

## 2020-05-17 ENCOUNTER — Other Ambulatory Visit: Payer: Self-pay | Admitting: Family Medicine

## 2020-05-17 DIAGNOSIS — Z1231 Encounter for screening mammogram for malignant neoplasm of breast: Secondary | ICD-10-CM

## 2020-06-28 ENCOUNTER — Other Ambulatory Visit: Payer: Self-pay

## 2020-06-28 ENCOUNTER — Ambulatory Visit
Admission: RE | Admit: 2020-06-28 | Discharge: 2020-06-28 | Disposition: A | Discharge status: Home / Self Care | Payer: Commercial Managed Care - PPO | Source: Ambulatory Visit | Attending: Family Medicine | Admitting: Family Medicine

## 2020-06-28 DIAGNOSIS — Z1231 Encounter for screening mammogram for malignant neoplasm of breast: Secondary | ICD-10-CM

## 2020-07-02 ENCOUNTER — Other Ambulatory Visit: Payer: Self-pay | Admitting: Family Medicine

## 2020-07-03 ENCOUNTER — Other Ambulatory Visit: Payer: Self-pay | Admitting: Family Medicine

## 2020-07-18 ENCOUNTER — Other Ambulatory Visit: Payer: Self-pay | Admitting: Family Medicine

## 2020-09-30 ENCOUNTER — Other Ambulatory Visit: Payer: Self-pay | Admitting: Family Medicine

## 2020-10-11 ENCOUNTER — Ambulatory Visit: Payer: Commercial Managed Care - PPO | Admitting: Family Medicine

## 2020-10-11 ENCOUNTER — Other Ambulatory Visit: Payer: Self-pay

## 2020-10-11 ENCOUNTER — Encounter: Payer: Self-pay | Admitting: Family Medicine

## 2020-10-11 VITALS — BP 120/64 | HR 90 | Temp 98.7°F | Resp 16 | Ht 64.0 in | Wt 286.0 lb

## 2020-10-11 DIAGNOSIS — I10 Essential (primary) hypertension: Secondary | ICD-10-CM | POA: Diagnosis not present

## 2020-10-11 DIAGNOSIS — E118 Type 2 diabetes mellitus with unspecified complications: Secondary | ICD-10-CM

## 2020-10-11 DIAGNOSIS — S301XXS Contusion of abdominal wall, sequela: Secondary | ICD-10-CM

## 2020-10-11 DIAGNOSIS — E785 Hyperlipidemia, unspecified: Secondary | ICD-10-CM

## 2020-10-11 MED ORDER — TRAMADOL HCL 50 MG PO TABS: 50.0000 mg | ORAL_TABLET | Freq: Three times a day (TID) | ORAL | 0 refills | Status: AC | PRN
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
## 2020-10-16 ENCOUNTER — Telehealth: Payer: Self-pay | Admitting: Family Medicine

## 2020-10-16 ENCOUNTER — Other Ambulatory Visit: Payer: Self-pay

## 2020-10-16 ENCOUNTER — Other Ambulatory Visit: Payer: Commercial Managed Care - PPO

## 2020-10-16 MED ORDER — METFORMIN HCL 500 MG PO TABS: 1.0000 | ORAL_TABLET | Freq: Two times a day (BID) | ORAL | 3 refills | Status: DC

## 2020-10-16 NOTE — Telephone Encounter (Signed)
Pt came in needing a refill of  metFORMIN (GLUCOPHAGE) 500 MG tablet   Cb#:[phone]

## 2020-10-16 NOTE — Telephone Encounter (Signed)
Prescription sent to pharmacy.

## 2020-10-17 LAB — COMPLETE METABOLIC PANEL WITH GFR
AG Ratio: 1.2 (calc) (ref 1.0–2.5)
ALT: 20 U/L (ref 6–29)
AST: 16 U/L (ref 10–35)
Albumin: 3.7 g/dL (ref 3.6–5.1)
Alkaline phosphatase (APISO): 62 U/L (ref 37–153)
BUN: 14 mg/dL (ref 7–25)
CO2: 29 mmol/L (ref 20–32)
Calcium: 9.5 mg/dL (ref 8.6–10.4)
Chloride: 101 mmol/L (ref 98–110)
Creat: 0.89 mg/dL (ref 0.50–0.99)
GFR, Est African American: 82 mL/min/{1.73_m2} (ref >=60)
GFR, Est Non African American: 70 mL/min/{1.73_m2} (ref >=60)
Globulin: 3.1 g/dL (calc) (ref 1.9–3.7)
Glucose, Bld: 124 mg/dL — ABNORMAL HIGH (ref 65–99)
Potassium: 4 mmol/L (ref 3.5–5.3)
Sodium: 141 mmol/L (ref 135–146)
Total Bilirubin: 0.7 mg/dL (ref 0.2–1.2)
Total Protein: 6.8 g/dL (ref 6.1–8.1)

## 2020-10-17 LAB — LIPID PANEL
Cholesterol: 121 mg/dL (ref <200)
HDL: 45 mg/dL — ABNORMAL LOW (ref >=50)
LDL Cholesterol (Calc): 56 mg/dL (calc)
Non-HDL Cholesterol (Calc): 76 mg/dL (calc) (ref <130)
Total CHOL/HDL Ratio: 2.7 (calc) (ref <5.0)
Triglycerides: 113 mg/dL (ref <150)

## 2020-10-17 LAB — CBC WITH DIFFERENTIAL/PLATELET
Absolute Monocytes: 585 cells/uL (ref 200–950)
Basophils Absolute: 23 cells/uL (ref 0–200)
Basophils Relative: 0.3 %
Eosinophils Absolute: 38 cells/uL (ref 15–500)
Eosinophils Relative: 0.5 %
HCT: 42.3 % (ref 35.0–45.0)
Hemoglobin: 13.5 g/dL (ref 11.7–15.5)
Lymphs Abs: 2880 cells/uL (ref 850–3900)
MCH: 28.8 pg (ref 27.0–33.0)
MCHC: 31.9 g/dL — ABNORMAL LOW (ref 32.0–36.0)
MCV: 90.2 fL (ref 80.0–100.0)
MPV: 11.5 fL (ref 7.5–12.5)
Monocytes Relative: 7.7 %
Neutro Abs: 4074 cells/uL (ref 1500–7800)
Neutrophils Relative %: 53.6 %
Platelets: 266 10*3/uL (ref 140–400)
RBC: 4.69 10*6/uL (ref 3.80–5.10)
RDW: 14.6 % (ref 11.0–15.0)
Total Lymphocyte: 37.9 %
WBC: 7.6 10*3/uL (ref 3.8–10.8)

## 2020-10-17 LAB — HEMOGLOBIN A1C
Hgb A1c MFr Bld: 6.8 % of total Hgb — ABNORMAL HIGH (ref <5.7)
Mean Plasma Glucose: 148 mg/dL
eAG (mmol/L): 8.2 mmol/L

## 2020-10-17 LAB — MICROALBUMIN, URINE: Microalb, Ur: 1.1 mg/dL

## 2020-11-22 ENCOUNTER — Other Ambulatory Visit: Payer: Self-pay | Admitting: *Deleted

## 2020-11-22 MED ORDER — ESCITALOPRAM OXALATE 10 MG PO TABS: 1.0000 | ORAL_TABLET | Freq: Every day | ORAL | 3 refills | Status: DC

## 2020-12-31 ENCOUNTER — Other Ambulatory Visit: Payer: Self-pay | Admitting: Family Medicine

## 2021-01-08 ENCOUNTER — Telehealth: Payer: Self-pay

## 2021-01-08 NOTE — Telephone Encounter (Signed)
Pt's spouse brought in physician results and screening form to be filled out again. Pt stated that company would not accept previous ppw with marks. Please call when ppw is available for pick up. PPW placed in nurse's folder.  Cb#: [phone]

## 2021-01-08 NOTE — Telephone Encounter (Signed)
Awaiting forms

## 2021-01-09 NOTE — Telephone Encounter (Signed)
Note placed in incorrect chart.   Forms are for spouse.

## 2021-01-14 ENCOUNTER — Other Ambulatory Visit: Payer: Self-pay | Admitting: Family Medicine

## 2021-01-28 ENCOUNTER — Other Ambulatory Visit: Payer: Self-pay | Admitting: Family Medicine

## 2021-02-24 ENCOUNTER — Other Ambulatory Visit: Payer: Self-pay | Admitting: Family Medicine

## 2021-03-24 ENCOUNTER — Other Ambulatory Visit: Payer: Self-pay | Admitting: Family Medicine

## 2021-04-18 ENCOUNTER — Other Ambulatory Visit: Payer: Self-pay | Admitting: Family Medicine

## 2021-05-22 ENCOUNTER — Other Ambulatory Visit: Payer: Self-pay | Admitting: Family Medicine

## 2021-06-18 ENCOUNTER — Other Ambulatory Visit: Payer: Self-pay | Admitting: Family Medicine

## 2021-06-18 DIAGNOSIS — Z1231 Encounter for screening mammogram for malignant neoplasm of breast: Secondary | ICD-10-CM

## 2021-06-23 ENCOUNTER — Other Ambulatory Visit: Payer: Self-pay | Admitting: Family Medicine

## 2021-07-08 ENCOUNTER — Ambulatory Visit
Admission: RE | Admit: 2021-07-08 | Discharge: 2021-07-08 | Disposition: A | Discharge status: Home / Self Care | Payer: Commercial Managed Care - PPO | Source: Ambulatory Visit | Attending: Family Medicine | Admitting: Family Medicine

## 2021-07-08 DIAGNOSIS — Z1231 Encounter for screening mammogram for malignant neoplasm of breast: Secondary | ICD-10-CM

## 2021-09-22 ENCOUNTER — Other Ambulatory Visit: Payer: Self-pay | Admitting: Family Medicine

## 2021-10-10 ENCOUNTER — Ambulatory Visit (INDEPENDENT_AMBULATORY_CARE_PROVIDER_SITE_OTHER): Payer: Commercial Managed Care - PPO | Admitting: Family Medicine

## 2021-10-10 VITALS — BP 122/80 | HR 81 | Temp 98.1°F | Resp 16 | Wt 286.0 lb

## 2021-10-10 DIAGNOSIS — E785 Hyperlipidemia, unspecified: Secondary | ICD-10-CM | POA: Diagnosis not present

## 2021-10-10 DIAGNOSIS — D369 Benign neoplasm, unspecified site: Secondary | ICD-10-CM | POA: Diagnosis not present

## 2021-10-10 DIAGNOSIS — E118 Type 2 diabetes mellitus with unspecified complications: Secondary | ICD-10-CM | POA: Diagnosis not present

## 2021-10-10 DIAGNOSIS — I10 Essential (primary) hypertension: Secondary | ICD-10-CM

## 2021-10-10 DIAGNOSIS — Z0000 Encounter for general adult medical examination without abnormal findings: Secondary | ICD-10-CM

## 2021-10-10 MED ORDER — TRAMADOL HCL 50 MG PO TABS: 50.0000 mg | ORAL_TABLET | Freq: Three times a day (TID) | ORAL | 0 refills | Status: AC | PRN
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
## 2021-10-11 LAB — COMPLETE METABOLIC PANEL WITH GFR
AG Ratio: 1.3 (calc) (ref 1.0–2.5)
ALT: 21 U/L (ref 6–29)
AST: 17 U/L (ref 10–35)
Albumin: 3.8 g/dL (ref 3.6–5.1)
Alkaline phosphatase (APISO): 70 U/L (ref 37–153)
BUN: 15 mg/dL (ref 7–25)
CO2: 30 mmol/L (ref 20–32)
Calcium: 9.2 mg/dL (ref 8.6–10.4)
Chloride: 104 mmol/L (ref 98–110)
Creat: 0.86 mg/dL (ref 0.50–1.05)
Globulin: 3 g/dL (calc) (ref 1.9–3.7)
Glucose, Bld: 106 mg/dL — ABNORMAL HIGH (ref 65–99)
Potassium: 4.8 mmol/L (ref 3.5–5.3)
Sodium: 142 mmol/L (ref 135–146)
Total Bilirubin: 0.5 mg/dL (ref 0.2–1.2)
Total Protein: 6.8 g/dL (ref 6.1–8.1)
eGFR: 77 mL/min/{1.73_m2} (ref >=60)

## 2021-10-11 LAB — CBC WITH DIFFERENTIAL/PLATELET
Absolute Monocytes: 718 cells/uL (ref 200–950)
Basophils Absolute: 42 cells/uL (ref 0–200)
Basophils Relative: 0.4 %
Eosinophils Absolute: 52 cells/uL (ref 15–500)
Eosinophils Relative: 0.5 %
HCT: 41.2 % (ref 35.0–45.0)
Hemoglobin: 13.3 g/dL (ref 11.7–15.5)
Lymphs Abs: 3359 cells/uL (ref 850–3900)
MCH: 28.9 pg (ref 27.0–33.0)
MCHC: 32.3 g/dL (ref 32.0–36.0)
MCV: 89.6 fL (ref 80.0–100.0)
MPV: 12.6 fL — ABNORMAL HIGH (ref 7.5–12.5)
Monocytes Relative: 6.9 %
Neutro Abs: 6230 cells/uL (ref 1500–7800)
Neutrophils Relative %: 59.9 %
Platelets: 268 10*3/uL (ref 140–400)
RBC: 4.6 10*6/uL (ref 3.80–5.10)
RDW: 14.3 % (ref 11.0–15.0)
Total Lymphocyte: 32.3 %
WBC: 10.4 10*3/uL (ref 3.8–10.8)

## 2021-10-11 LAB — HEMOGLOBIN A1C
Hgb A1c MFr Bld: 6.9 % of total Hgb — ABNORMAL HIGH (ref <5.7)
Mean Plasma Glucose: 151 mg/dL
eAG (mmol/L): 8.4 mmol/L

## 2021-10-11 LAB — LIPID PANEL
Cholesterol: 146 mg/dL (ref <200)
HDL: 40 mg/dL — ABNORMAL LOW (ref >=50)
LDL Cholesterol (Calc): 68 mg/dL (calc)
Non-HDL Cholesterol (Calc): 106 mg/dL (calc) (ref <130)
Total CHOL/HDL Ratio: 3.7 (calc) (ref <5.0)
Triglycerides: 292 mg/dL — ABNORMAL HIGH (ref <150)

## 2021-10-22 ENCOUNTER — Other Ambulatory Visit: Payer: Self-pay | Admitting: Family Medicine

## 2021-10-22 NOTE — Telephone Encounter (Signed)
Appointment 10/10/21- diabetes addressed ?Will RF 6 months per protocol ?Requested Prescriptions  ?Pending Prescriptions Disp Refills  ?? metFORMIN (GLUCOPHAGE) 500 MG tablet [Pharmacy Med Name: METFORMIN HCL 500 MG TABLET] 60 tablet 0  ?  Sig: TAKE 1 TABLET BY MOUTH TWICE A DAY WITH MEALS  ?  ? Endocrinology:  Diabetes - Biguanides Failed - 10/22/2021  2:46 AM  ?  ?  Failed - B12 Level in normal range and within 720 days  ?  No results found for: VITAMINB12   ?  ?  Failed - Valid encounter within last 6 months  ?  Recent Outpatient Visits   ?      ? 1 week ago Tubular adenoma  ? [facility] [place], Cammie Mcgee, MD  ? 1 year ago Controlled diabetes mellitus type 2 with complications, unspecified whether long term insulin use ([place])  ? [facility] Pick[place], Cammie Mcgee, MD  ? 1 year ago Controlled diabetes mellitus type 2 with complications, unspecified whether long term insulin use ([place])  ? [facility] [place], Cammie Mcgee, MD  ? 2 years ago Benign essential HTN  ? [facility] Pick[place], Cammie Mcgee, MD  ? 2 years ago Cellulitis of left arm  ? [facility] Pick[place], Cammie Mcgee, MD  ?  ?  ? ?  ?  ?  Failed - CBC within normal limits and completed in the last 12 months  ?  WBC  ?Date Value Ref Range Status  ?10/10/2021 10.4 3.8 - 10.8 Thousand/uL Final  ? ?RBC  ?Date Value Ref Range Status  ?10/10/2021 4.60 3.80 - 5.10 Million/uL Final  ? ?Hemoglobin  ?Date Value Ref Range Status  ?10/10/2021 13.3 11.7 - 15.5 g/dL Final  ? ?HCT  ?Date Value Ref Range Status  ?10/10/2021 41.2 35.0 - 45.0 % Final  ? ?MCHC  ?Date Value Ref Range Status  ?10/10/2021 32.3 32.0 - 36.0 g/dL Final  ? ?MCH  ?Date Value Ref Range Status  ?10/10/2021 28.9 27.0 - 33.0 pg Final  ? ?MCV  ?Date Value Ref Range Status  ?10/10/2021 89.6 80.0 - 100.0 fL Final  ? ?No results found for: PLTCOUNTKUC, LABPLAT, [place] ?RDW  ?Date Value Ref Range Status  ?10/10/2021 14.3 11.0 - 15.0 % Final  ? ?   ?  ?  Passed - Cr in normal range and within 360 days  ?  Creat  ?Date Value Ref Range Status  ?10/10/2021 0.86 0.50 - 1.05 mg/dL Final  ?   ?  ?  Passed - HBA1C is between 0 and 7.9 and within 180 days  ?  Hgb A1c MFr Bld  ?Date Value Ref Range Status  ?10/10/2021 6.9 (H) <5.7 % of total Hgb Final  ?  Comment:  ?  For someone without known diabetes, a hemoglobin A1c ?value of 6.5% or greater indicates that they may have  ?diabetes and this should be confirmed with a follow-up  ?test. ?. ?For someone with known diabetes, a value <7% indicates  ?that their diabetes is well controlled and a value  ?greater than or equal to 7% indicates suboptimal  ?control. A1c targets should be individualized based on  ?duration of diabetes, age, comorbid conditions, and  ?other considerations. ?. ?Currently, no consensus exists regarding use of ?hemoglobin A1c for diagnosis of diabetes for children. ?. ?  ?   ?  ?  Passed - eGFR in normal range and within 360 days  ?  GFR, Est African American  ?Date Value Ref Range Status  ?10/16/2020 82 > OR = 60 mL/min/1.28m Final  ? ?GFR, Est Non African American  ?Date Value Ref Range Status  ?10/16/2020 70 > OR = 60 mL/min/1.715mFinal  ? ?eGFR  ?Date Value Ref Range Status  ?10/10/2021 77 > OR = 60 mL/min/1.7381minal  ?  Comment:  ?  The eGFR is based on the CKD-EPI 2021 equation. To calculate  ?the new eGFR from a previous Creatinine or Cystatin C ?result, go to https://www.kidney.org/professionals/ ?kdoqi/gfr%5Fcalculator ?  ?   ?  ?  ? ?

## 2021-11-14 ENCOUNTER — Telehealth: Payer: Self-pay

## 2021-11-14 NOTE — Telephone Encounter (Signed)
Pt called to let you know that she is going to stay on the medication for depression . She was told to call back to let you know.

## 2021-12-21 ENCOUNTER — Other Ambulatory Visit: Payer: Self-pay | Admitting: Family Medicine

## 2021-12-22 NOTE — Telephone Encounter (Signed)
Patient had recent OV 10/10/21, will refill medication.  Requested Prescriptions  Pending Prescriptions Disp Refills  . JANUVIA 100 MG tablet [Pharmacy Med Name: JANUVIA 100 MG TABLET] 90 tablet 0    Sig: TAKE 1 TABLET BY MOUTH EVERY DAY     Endocrinology:  Diabetes - DPP-4 Inhibitors Failed - 12/22/2021 11:51 AM      Failed - Valid encounter within last 6 months    Recent Outpatient Visits          2 months ago Tubular adenoma   [place] Susy Frizzle, MD   1 year ago Controlled diabetes mellitus type 2 with complications, unspecified whether long term insulin use ([place])   [place] Pickard, Cammie Mcgee, MD   1 year ago Controlled diabetes mellitus type 2 with complications, unspecified whether long term insulin use ([place])   [place] Pickard, Cammie Mcgee, MD   2 years ago Benign essential HTN   [place] Susy Frizzle, MD   3 years ago Cellulitis of left arm   [place] Pickard, Cammie Mcgee, MD             Passed - HBA1C is between 0 and 7.9 and within 180 days    Hgb A1c MFr Bld  Date Value Ref Range Status  10/10/2021 6.9 (H) <5.7 % of total Hgb Final    Comment:    For someone without known diabetes, a hemoglobin A1c value of 6.5% or greater indicates that they may have  diabetes and this should be confirmed with a follow-up  test. . For someone with known diabetes, a value <7% indicates  that their diabetes is well controlled and a value  greater than or equal to 7% indicates suboptimal  control. A1c targets should be individualized based on  duration of diabetes, age, comorbid conditions, and  other considerations. . Currently, no consensus exists regarding use of hemoglobin A1c for diagnosis of diabetes for children. .          Passed - Cr in normal range and within 360 days    Creat  Date Value Ref Range Status  10/10/2021 0.86 0.50 - 1.05 mg/dL Final

## 2021-12-22 NOTE — Telephone Encounter (Signed)
Pharmacy faxed a refill request for atorvastatin (LIPITOR) 20 MG tablet [[id number]]    Order Details Dose: 20 mg Route: Oral Frequency: Daily  Dispense Quantity: 90 tablet Refills: 1        Sig: TAKE 1 TABLET (20 MG TOTAL) BY MOUTH DAILY. FOLLOW UP APPT REQUIRED       Start Date: 06/24/21 End Date: --  Written Date: 06/24/21 Expiration Date: 06/24/22

## 2022-02-03 ENCOUNTER — Other Ambulatory Visit: Payer: Self-pay

## 2022-02-03 NOTE — Telephone Encounter (Signed)
Pharmacy faxed a refill request for escitalopram (LEXAPRO) 10 MG tablet [[id number]]    Order Details Dose: 1 tablet Route: Oral Frequency: Daily  Dispense Quantity: 90 tablet Refills: 3        Sig: Take 1 tablet (10 mg total) by mouth daily.       Start Date: 11/22/20 End Date: --  Written Date: 11/22/20 Expiration Date: 11/22/21

## 2022-02-04 NOTE — Telephone Encounter (Signed)
Requested medication (s) are due for refill today: expired medication  Requested medication (s) are on the active medication list: yes  Last refill:  11/22/20 #90 3 refills  Future visit scheduled: no   Notes to clinic:  expired medication. Do you want to renew Rx? Called patient to schedule appt no answer. LVMTCB     Requested Prescriptions  Pending Prescriptions Disp Refills   escitalopram (LEXAPRO) 10 MG tablet 90 tablet 3    Sig: Take 1 tablet (10 mg total) by mouth daily.     Psychiatry:  Antidepressants - SSRI Failed - 02/03/2022  5:02 PM      Failed - Completed PHQ-2 or PHQ-9 in the last 360 days      Failed - Valid encounter within last 6 months    Recent Outpatient Visits           3 months ago Tubular adenoma   [place] Susy Frizzle, MD   1 year ago Controlled diabetes mellitus type 2 with complications, unspecified whether long term insulin use ([place])   [place] Susy Frizzle, MD   2 years ago Controlled diabetes mellitus type 2 with complications, unspecified whether long term insulin use ([place])   [place] Susy Frizzle, MD   2 years ago Benign essential HTN   [place], Warren T, MD   3 years ago Cellulitis of left arm   [place] Pickard, Cammie Mcgee, MD

## 2022-02-04 NOTE — Telephone Encounter (Signed)
Patient called back she was seen in April 28,23 for a CPE. She has about 3 weeks left of this prescription. She will make an appt if needed but was just seen a couple of months ago. Please advise.

## 2022-02-04 NOTE — Telephone Encounter (Signed)
Called patient to schedule appt for medication refills/annual exam. No answer. LVMTCB [phone].

## 2022-02-05 MED ORDER — ESCITALOPRAM OXALATE 10 MG PO TABS
10.0000 mg | ORAL_TABLET | Freq: Every day | ORAL | 3 refills | Status: DC
Start: 2022-02-05 — End: 2023-01-07

## 2022-03-12 ENCOUNTER — Telehealth: Payer: Self-pay

## 2022-03-12 ENCOUNTER — Other Ambulatory Visit: Payer: Self-pay

## 2022-03-12 DIAGNOSIS — Z86010 Personal history of colonic polyps: Secondary | ICD-10-CM

## 2022-03-12 MED ORDER — SUTAB 1479-225-188 MG PO TABS: 12.0000 | ORAL_TABLET | Freq: Once | ORAL | 0 refills | Status: AC

## 2022-03-12 NOTE — Telephone Encounter (Signed)
Gastroenterology Pre-Procedure Review  Request Date: 06/04/2022 Requesting Physician: Dr. Allen Norris   PATIENT REVIEW QUESTIONS: The patient responded to the following health history questions as indicated:    1. Are you having any GI issues? no 2. Do you have a personal history of Polyps? Yes 2017 by wohl  3. Do you have a family history of Colon Cancer or Polyps? no 4. Diabetes Mellitus? Yes metformin and Januvia   5. Joint replacements in the past 12 months?no 6. Major health problems in the past 3 months?no 7. Any artificial heart valves, MVP, or defibrillator?no    MEDICATIONS & ALLERGIES:    Patient reports the following regarding taking any anticoagulation/antiplatelet therapy:   Plavix, Coumadin, Eliquis, Xarelto, Lovenox, Pradaxa, Brilinta, or Effient? no Aspirin? no  Patient confirms/reports the following medications:  Current Outpatient Medications  Medication Sig Dispense Refill   atorvastatin (LIPITOR) 20 MG tablet TAKE 1 TABLET (20 MG TOTAL) BY MOUTH DAILY. FOLLOW UP APPT REQUIRED 90 tablet 1   Cholecalciferol (VITAMIN D3) 5000 units CHEW Chew by mouth.     escitalopram (LEXAPRO) 10 MG tablet Take 1 tablet (10 mg total) by mouth daily. 90 tablet 3   JANUVIA 100 MG tablet TAKE 1 TABLET BY MOUTH EVERY DAY 90 tablet 0   lisinopril-hydrochlorothiazide (ZESTORETIC) 20-12.5 MG tablet TAKE 1 TABLET BY MOUTH EVERY DAY 90 tablet 3   metFORMIN (GLUCOPHAGE) 500 MG tablet TAKE 1 TABLET BY MOUTH TWICE A DAY WITH MEALS 60 tablet 5   No current facility-administered medications for this visit.    Patient confirms/reports the following allergies:  No Known Allergies  No orders of the defined types were placed in this encounter.   AUTHORIZATION INFORMATION Primary Insurance: 1D#: Group #:  Secondary Insurance: 1D#: Group #:  SCHEDULE INFORMATION: Date:  Time: Location:

## 2022-03-16 ENCOUNTER — Ambulatory Visit: Payer: Commercial Managed Care - PPO | Admitting: Gastroenterology

## 2022-03-27 ENCOUNTER — Other Ambulatory Visit: Payer: Self-pay | Admitting: Family Medicine

## 2022-04-18 ENCOUNTER — Other Ambulatory Visit: Payer: Self-pay | Admitting: Gastroenterology

## 2022-04-18 ENCOUNTER — Other Ambulatory Visit: Payer: Self-pay | Admitting: Family Medicine

## 2022-04-20 ENCOUNTER — Other Ambulatory Visit: Payer: Self-pay

## 2022-04-20 ENCOUNTER — Telehealth: Payer: Self-pay

## 2022-04-20 MED ORDER — ATORVASTATIN CALCIUM 20 MG PO TABS: 20.0000 mg | ORAL_TABLET | Freq: Every day | ORAL | 1 refills | Status: DC

## 2022-04-20 NOTE — Telephone Encounter (Signed)
  Prescription Request  04/20/2022  Is this a "Controlled Substance" medicine? No  LOV: 02/03/2022   What is the name of the medication or equipment? atorvastatin (LIPITOR) 20 MG tablet   Have you contacted your pharmacy to request a refill? Yes   Which pharmacy would you like this sent to?  [id number] [place] [address] [place] [address]Phone: [phone]: [phone]  Patient notified that their request is being sent to the clinical staff for review and that they should receive a response within 2 business days.   Please advise at [phone]

## 2022-04-20 NOTE — Telephone Encounter (Signed)
Requested medication (s) are due for refill today: yes  Requested medication (s) are on the active medication list: yes  Last refill:  10/22/21 and 03/27/22  Future visit scheduled: no  Notes to clinic:  Unable to refill per protocol, appointment needed. Routing for approval.     Requested Prescriptions  Pending Prescriptions Disp Refills   metFORMIN (GLUCOPHAGE) 500 MG tablet [Pharmacy Med Name: METFORMIN HCL 500 MG TABLET] 60 tablet 5    Sig: TAKE 1 TABLET BY MOUTH TWICE A DAY WITH MEALS     Endocrinology:  Diabetes - Biguanides Failed - 04/18/2022 11:19 AM      Failed - HBA1C is between 0 and 7.9 and within 180 days    Hgb A1c MFr Bld  Date Value Ref Range Status  10/10/2021 6.9 (H) <5.7 % of total Hgb Final    Comment:    For someone without known diabetes, a hemoglobin A1c value of 6.5% or greater indicates that they may have  diabetes and this should be confirmed with a follow-up  test. . For someone with known diabetes, a value <7% indicates  that their diabetes is well controlled and a value  greater than or equal to 7% indicates suboptimal  control. A1c targets should be individualized based on  duration of diabetes, age, comorbid conditions, and  other considerations. . Currently, no consensus exists regarding use of hemoglobin A1c for diagnosis of diabetes for children. .          Failed - B12 Level in normal range and within 720 days    No results found for: "VITAMINB12"       Failed - Valid encounter within last 6 months    Recent Outpatient Visits           6 months ago Tubular adenoma   [place] Susy Frizzle, MD   1 year ago Controlled diabetes mellitus type 2 with complications, unspecified whether long term insulin use ([place])   [place] Susy Frizzle, MD   2 years ago Controlled diabetes mellitus type 2 with complications, unspecified whether long term insulin use ([place])   [place]  Pickard, Cammie Mcgee, MD   3 years ago Benign essential HTN   [place] Susy Frizzle, MD   3 years ago Cellulitis of left arm   [place], Cammie Mcgee, MD              Passed - Cr in normal range and within 360 days    Creat  Date Value Ref Range Status  10/10/2021 0.86 0.50 - 1.05 mg/dL Final         Passed - eGFR in normal range and within 360 days    GFR, Est African American  Date Value Ref Range Status  10/16/2020 82 > OR = 60 mL/min/1.36m Final   GFR, Est Non African American  Date Value Ref Range Status  10/16/2020 70 > OR = 60 mL/min/1.729mFinal   eGFR  Date Value Ref Range Status  10/10/2021 77 > OR = 60 mL/min/1.7372minal    Comment:    The eGFR is based on the CKD-EPI 2021 equation. To calculate  the new eGFR from a previous Creatinine or Cystatin C result, go to https://www.kidney.org/professionals/ kdoqi/gfr%5Fcalculator          Passed - CBC within normal limits and completed in the last 12 months    WBC  Date Value  Ref Range Status  10/10/2021 10.4 3.8 - 10.8 Thousand/uL Final   RBC  Date Value Ref Range Status  10/10/2021 4.60 3.80 - 5.10 Million/uL Final   Hemoglobin  Date Value Ref Range Status  10/10/2021 13.3 11.7 - 15.5 g/dL Final   HCT  Date Value Ref Range Status  10/10/2021 41.2 35.0 - 45.0 % Final   MCHC  Date Value Ref Range Status  10/10/2021 32.3 32.0 - 36.0 g/dL Final   MCH  Date Value Ref Range Status  10/10/2021 28.9 27.0 - 33.0 pg Final   MCV  Date Value Ref Range Status  10/10/2021 89.6 80.0 - 100.0 fL Final   No results found for: "PLTCOUNTKUC", "LABPLAT", "POCPLA" RDW  Date Value Ref Range Status  10/10/2021 14.3 11.0 - 15.0 % Final          JANUVIA 100 MG tablet [Pharmacy Med Name: JANUVIA 100 MG TABLET] 90 tablet 0    Sig: TAKE 1 TABLET BY [place]     Endocrinology:  Diabetes - DPP-4 Inhibitors Failed - 04/18/2022 11:19 AM      Failed - HBA1C is  between 0 and 7.9 and within 180 days    Hgb A1c MFr Bld  Date Value Ref Range Status  10/10/2021 6.9 (H) <5.7 % of total Hgb Final    Comment:    For someone without known diabetes, a hemoglobin A1c value of 6.5% or greater indicates that they may have  diabetes and this should be confirmed with a follow-up  test. . For someone with known diabetes, a value <7% indicates  that their diabetes is well controlled and a value  greater than or equal to 7% indicates suboptimal  control. A1c targets should be individualized based on  duration of diabetes, age, comorbid conditions, and  other considerations. . Currently, no consensus exists regarding use of hemoglobin A1c for diagnosis of diabetes for children. .          Failed - Valid encounter within last 6 months    Recent Outpatient Visits           6 months ago Tubular adenoma   [place] Susy Frizzle, MD   1 year ago Controlled diabetes mellitus type 2 with complications, unspecified whether long term insulin use ([place])   [place] Susy Frizzle, MD   2 years ago Controlled diabetes mellitus type 2 with complications, unspecified whether long term insulin use ([place])   [place] Susy Frizzle, MD   3 years ago Benign essential HTN   [place] Susy Frizzle, MD   3 years ago Cellulitis of left arm   [place], Cammie Mcgee, MD              Passed - Cr in normal range and within 360 days    Creat  Date Value Ref Range Status  10/10/2021 0.86 0.50 - 1.05 mg/dL Final

## 2022-04-23 ENCOUNTER — Other Ambulatory Visit: Payer: Self-pay

## 2022-04-23 NOTE — Telephone Encounter (Signed)
Requested medication (s) are due for refill today - expired Rx  Requested medication (s) are on the active medication list -yes  Future visit scheduled -no  Last refill: 05/01/21 #90 3RF  Notes to clinic: expired Rx  Requested Prescriptions  Pending Prescriptions Disp Refills   lisinopril-hydrochlorothiazide (ZESTORETIC) 20-12.5 MG tablet 90 tablet 3    Sig: Take 1 tablet by mouth daily.     Cardiovascular:  ACEI + Diuretic Combos Failed - 04/23/2022  3:29 PM      Failed - Na in normal range and within 180 days    Sodium  Date Value Ref Range Status  10/10/2021 142 135 - 146 mmol/L Final         Failed - K in normal range and within 180 days    Potassium  Date Value Ref Range Status  10/10/2021 4.8 3.5 - 5.3 mmol/L Final         Failed - Cr in normal range and within 180 days    Creat  Date Value Ref Range Status  10/10/2021 0.86 0.50 - 1.05 mg/dL Final         Failed - eGFR is 30 or above and within 180 days    GFR, Est African American  Date Value Ref Range Status  10/16/2020 82 > OR = 60 mL/min/1.74m Final   GFR, Est Non African American  Date Value Ref Range Status  10/16/2020 70 > OR = 60 mL/min/1.765mFinal   eGFR  Date Value Ref Range Status  10/10/2021 77 > OR = 60 mL/min/1.7351minal    Comment:    The eGFR is based on the CKD-EPI 2021 equation. To calculate  the new eGFR from a previous Creatinine or Cystatin C result, go to https://www.kidney.org/professionals/ kdoqi/gfr%5Fcalculator          Failed - Valid encounter within last 6 months    Recent Outpatient Visits           6 months ago Tubular adenoma   [place]   1 year ago Controlled diabetes mellitus type 2 with complications, unspecified whether long term insulin use ([facility]   2 years ago Controlled diabetes mellitus type 2 with complications, unspecified whether long term insulin use ([facility]CC[place]  [place], [place]ie Mcgee[place]   3 years ago Benign essential HTN   [place]   3 years ago Cellulitis of left arm   [place], [place]              Passed - Patient is not pregnant      Passed - Last BP in normal range    BP Readings from Last 1 Encounters:  10/10/21 122/80            Requested Prescriptions  Pending Prescriptions Disp Refills   lisinopril-hydrochlorothiazide (ZESTORETIC) 20-12.5 MG tablet 90 tablet 3    Sig: Take 1 tablet by mouth daily.     Cardiovascular:  ACEI + Diuretic Combos Failed - 04/23/2022  3:29 PM      Failed - Na in normal range and within 180 days    Sodium  Date Value Ref Range Status  10/10/2021 142 135 - 146 mmol/L Final         Failed - K in normal range and within 180 days    Potassium  Date Value Ref Range Status  10/10/2021 4.8 3.5 - 5.3 mmol/L Final         Failed - Cr in normal range and within 180 days    Creat  Date Value Ref Range Status  10/10/2021 0.86 0.50 - 1.05 mg/dL Final         Failed - eGFR is 30 or above and within 180 days    GFR, Est African American  Date Value Ref Range Status  10/16/2020 82 > OR = 60 mL/min/1.32m Final   GFR, Est Non African American  Date Value Ref Range Status  10/16/2020 70 > OR = 60 mL/min/1.737mFinal   eGFR  Date Value Ref Range Status  10/10/2021 77 > OR = 60 mL/min/1.7317minal    Comment:    The eGFR is based on the CKD-EPI 2021 equation. To calculate  the new eGFR from a previous Creatinine or Cystatin C result, go to https://www.kidney.org/professionals/ kdoqi/gfr%5Fcalculator          Failed - Valid encounter within last 6 months    Recent Outpatient Visits           6 months ago Tubular adenoma   [place]   1 year ago Controlled diabetes mellitus type 2 with complications, unspecified whether long term insulin use (HCC[place]  [place]   2 years ago Controlled diabetes mellitus type 2 with complications, unspecified whether long term insulin use (HCCIdam[place]   3 years ago Benign essential HTN   [place]   3 years ago Cellulitis of left arm   Bro[place]ammie McgeeD              Passed - Patient is not pregnant      Passed - Last BP in normal range    BP Readings from Last 1 Encounters:  10/10/21 122/80

## 2022-04-23 NOTE — Telephone Encounter (Signed)
  Prescription Request  04/23/2022  Is this a "Controlled Substance" medicine? No  LOV: 04/20/2022   What is the name of the medication or equipment? lisinopril-hydrochlorothiazide (ZESTORETIC) 20-12.5 MG tablet   Have you contacted your pharmacy to request a refill? Yes   Which pharmacy would you like this sent to?  [id number] [place] [address] [address]Phone: [phone]: [phone]  Patient notified that their request is being sent to the clinical staff for review and that they should receive a response within 2 business days.   Please advise at [phone](mobile)

## 2022-05-24 ENCOUNTER — Other Ambulatory Visit: Payer: Self-pay | Admitting: Family Medicine

## 2022-05-24 ENCOUNTER — Other Ambulatory Visit: Payer: Self-pay | Admitting: Gastroenterology

## 2022-05-28 ENCOUNTER — Telehealth: Payer: Self-pay | Admitting: Gastroenterology

## 2022-05-28 DIAGNOSIS — Z86010 Personal history of colonic polyps: Secondary | ICD-10-CM

## 2022-05-28 NOTE — Telephone Encounter (Signed)
Pt is cancelling procedure for 06/04/2022 will call back after the holidays and resched

## 2022-05-28 NOTE — Telephone Encounter (Signed)
Patient's colonoscopy has been canceled with Erasmo Downer, in Endo for 06/04/22.  Thanks,  [place], [place]

## 2022-05-29 ENCOUNTER — Other Ambulatory Visit: Payer: Self-pay

## 2022-05-29 DIAGNOSIS — Z86010 Personal history of colonic polyps: Secondary | ICD-10-CM

## 2022-05-29 NOTE — Telephone Encounter (Signed)
Patient left a voicemail she is wanting to reschedule her colonoscopy

## 2022-06-04 ENCOUNTER — Encounter: Admission: RE | Payer: Self-pay | Source: Home / Self Care

## 2022-06-04 ENCOUNTER — Ambulatory Visit
Admission: RE | Admit: 2022-06-04 | Payer: Commercial Managed Care - PPO | Source: Home / Self Care | Admitting: Gastroenterology

## 2022-06-04 SURGERY — COLONOSCOPY WITH PROPOFOL
Anesthesia: General

## 2022-06-24 NOTE — Telephone Encounter (Signed)
ERROR

## 2022-06-25 ENCOUNTER — Other Ambulatory Visit: Payer: Self-pay

## 2022-06-25 ENCOUNTER — Encounter: Payer: Self-pay | Admitting: Gastroenterology

## 2022-06-26 ENCOUNTER — Encounter: Payer: Self-pay | Admitting: Anesthesiology

## 2022-06-26 ENCOUNTER — Encounter
Admission: RE | Admit: 2022-06-26 | Discharge: 2022-06-26 | Disposition: A | Discharge status: Home / Self Care | Payer: Commercial Managed Care - PPO | Source: Ambulatory Visit | Attending: Gastroenterology | Admitting: Gastroenterology

## 2022-06-26 DIAGNOSIS — Z01812 Encounter for preprocedural laboratory examination: Secondary | ICD-10-CM | POA: Insufficient documentation

## 2022-06-26 DIAGNOSIS — Z79899 Other long term (current) drug therapy: Secondary | ICD-10-CM | POA: Insufficient documentation

## 2022-06-26 LAB — POTASSIUM: Potassium: 3.4 mmol/L — ABNORMAL LOW (ref 3.5–5.1)

## 2022-06-29 ENCOUNTER — Telehealth: Payer: Self-pay

## 2022-06-29 NOTE — Telephone Encounter (Signed)
-----  Message from Lady Gary, RN sent at 06/29/2022  7:06 AM EST ----- Regarding: BMI > 50 This is a pt for 1/22.  Her BMI is greater than 50.  She will need to be moved to [facility].  Thanks.

## 2022-06-29 NOTE — Telephone Encounter (Addendum)
Patient has been informed that her BMI is too high for [facility] and her procedure will need to be moved.  Her BMI is greater than 50.  Patient has been informed that unfortunately we can not move her to the same date at this time due to to schedule is full for Dr. Vicente Males and Dr. Marius Ditch.  She would like to wait it out and see if anyone falls off the schedule for [facility].  Thanks,  [place], [place]

## 2022-07-02 NOTE — Telephone Encounter (Signed)
Patient has decided not to have her colonoscopy with [place].  She said she had already started changing her diet to prepare for the procedure and this happened.  I was trying to explain to her that we will do her colonoscopy as scheduled for the same day with Dr. Marius Ditch but she has declined to remain on the schedule.  Colonoscopy has been canceled with Dr. Marius Ditch for 07/06/22.  Thanks, [place], [place]

## 2022-07-06 ENCOUNTER — Ambulatory Visit: Admit: 2022-07-06 | Payer: Commercial Managed Care - PPO | Admitting: Gastroenterology

## 2022-07-06 DIAGNOSIS — Z01812 Encounter for preprocedural laboratory examination: Secondary | ICD-10-CM

## 2022-07-06 DIAGNOSIS — Z79899 Other long term (current) drug therapy: Secondary | ICD-10-CM

## 2022-07-06 HISTORY — DX: Presence of dental prosthetic device (complete) (partial): Z97.2

## 2022-07-06 SURGERY — COLONOSCOPY WITH PROPOFOL
Anesthesia: General

## 2022-07-17 ENCOUNTER — Other Ambulatory Visit: Payer: Self-pay | Admitting: Gastroenterology

## 2022-07-28 ENCOUNTER — Other Ambulatory Visit: Payer: Self-pay | Admitting: Family Medicine

## 2022-07-28 DIAGNOSIS — Z1231 Encounter for screening mammogram for malignant neoplasm of breast: Secondary | ICD-10-CM

## 2022-07-30 ENCOUNTER — Ambulatory Visit
Admission: RE | Admit: 2022-07-30 | Discharge: 2022-07-30 | Disposition: A | Discharge status: Home / Self Care | Payer: Commercial Managed Care - PPO | Source: Ambulatory Visit | Attending: Family Medicine | Admitting: Family Medicine

## 2022-07-30 DIAGNOSIS — Z1231 Encounter for screening mammogram for malignant neoplasm of breast: Secondary | ICD-10-CM

## 2022-09-14 ENCOUNTER — Telehealth: Payer: Self-pay | Admitting: Family Medicine

## 2022-09-14 ENCOUNTER — Ambulatory Visit (INDEPENDENT_AMBULATORY_CARE_PROVIDER_SITE_OTHER): Payer: Commercial Managed Care - PPO | Admitting: Family Medicine

## 2022-09-14 ENCOUNTER — Encounter: Payer: Self-pay | Admitting: Family Medicine

## 2022-09-14 VITALS — BP 132/82 | HR 77 | Temp 98.6°F | Ht 64.0 in | Wt 289.0 lb

## 2022-09-14 DIAGNOSIS — E118 Type 2 diabetes mellitus with unspecified complications: Secondary | ICD-10-CM | POA: Diagnosis not present

## 2022-09-14 DIAGNOSIS — Z0001 Encounter for general adult medical examination with abnormal findings: Secondary | ICD-10-CM

## 2022-09-14 DIAGNOSIS — Z23 Encounter for immunization: Secondary | ICD-10-CM | POA: Diagnosis not present

## 2022-09-14 DIAGNOSIS — I10 Essential (primary) hypertension: Secondary | ICD-10-CM

## 2022-09-14 DIAGNOSIS — D369 Benign neoplasm, unspecified site: Secondary | ICD-10-CM | POA: Diagnosis not present

## 2022-09-14 DIAGNOSIS — Z0000 Encounter for general adult medical examination without abnormal findings: Secondary | ICD-10-CM

## 2022-09-14 DIAGNOSIS — Z124 Encounter for screening for malignant neoplasm of cervix: Secondary | ICD-10-CM

## 2022-09-14 DIAGNOSIS — E785 Hyperlipidemia, unspecified: Secondary | ICD-10-CM

## 2022-09-14 MED ORDER — TRAMADOL HCL 50 MG PO TABS: 50.0000 mg | ORAL_TABLET | Freq: Three times a day (TID) | ORAL | 0 refills | Status: AC | PRN

## 2022-09-14 MED ORDER — RYBELSUS 3 MG PO TABS
3.0000 mg | ORAL_TABLET | Freq: Every day | ORAL | 1 refills | Status: DC
Start: 2022-09-14 — End: 2022-11-20

## 2022-09-14 NOTE — Telephone Encounter (Signed)
Patient forgot to request form completion at [facility] this morning; requesting call when form completed and ready for pickup.  Form placed on nurse's desk.   Please advise at [phone].
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
## 2022-09-14 NOTE — Addendum Note (Signed)
Addended by: Randal Buba K on: 09/14/2022 09:17 AM   Modules accepted: Orders

## 2022-09-15 ENCOUNTER — Other Ambulatory Visit: Payer: Self-pay | Admitting: Family Medicine

## 2022-09-15 LAB — COMPLETE METABOLIC PANEL WITH GFR
AG Ratio: 1.2 (calc) (ref 1.0–2.5)
ALT: 18 U/L (ref 6–29)
AST: 14 U/L (ref 10–35)
Albumin: 3.7 g/dL (ref 3.6–5.1)
Alkaline phosphatase (APISO): 72 U/L (ref 37–153)
BUN: 13 mg/dL (ref 7–25)
CO2: 32 mmol/L (ref 20–32)
Calcium: 9 mg/dL (ref 8.6–10.4)
Chloride: 102 mmol/L (ref 98–110)
Creat: 0.88 mg/dL (ref 0.50–1.05)
Globulin: 3 g/dL (calc) (ref 1.9–3.7)
Glucose, Bld: 152 mg/dL — ABNORMAL HIGH (ref 65–99)
Potassium: 3.9 mmol/L (ref 3.5–5.3)
Sodium: 139 mmol/L (ref 135–146)
Total Bilirubin: 0.6 mg/dL (ref 0.2–1.2)
Total Protein: 6.7 g/dL (ref 6.1–8.1)
eGFR: 74 mL/min/{1.73_m2} (ref >=60)

## 2022-09-15 LAB — CBC WITH DIFFERENTIAL/PLATELET
Absolute Monocytes: 566 cells/uL (ref 200–950)
Basophils Absolute: 33 cells/uL (ref 0–200)
Basophils Relative: 0.4 %
Eosinophils Absolute: 74 cells/uL (ref 15–500)
Eosinophils Relative: 0.9 %
HCT: 43.2 % (ref 35.0–45.0)
Hemoglobin: 13.7 g/dL (ref 11.7–15.5)
Lymphs Abs: 2927.4 cells/uL (ref 850–3900)
MCH: 28.4 pg (ref 27.0–33.0)
MCHC: 31.7 g/dL — ABNORMAL LOW (ref 32.0–36.0)
MCV: 89.6 fL (ref 80.0–100.0)
MPV: 11.8 fL (ref 7.5–12.5)
Monocytes Relative: 6.9 %
Neutro Abs: 4600 cells/uL (ref 1500–7800)
Neutrophils Relative %: 56.1 %
Platelets: 243 10*3/uL (ref 140–400)
RBC: 4.82 10*6/uL (ref 3.80–5.10)
RDW: 14.5 % (ref 11.0–15.0)
Total Lymphocyte: 35.7 %
WBC: 8.2 10*3/uL (ref 3.8–10.8)

## 2022-09-15 LAB — LIPID PANEL
Cholesterol: 144 mg/dL (ref <200)
HDL: 42 mg/dL — ABNORMAL LOW (ref >=50)
LDL Cholesterol (Calc): 74 mg/dL (calc)
Non-HDL Cholesterol (Calc): 102 mg/dL (calc) (ref <130)
Total CHOL/HDL Ratio: 3.4 (calc) (ref <5.0)
Triglycerides: 185 mg/dL — ABNORMAL HIGH (ref <150)

## 2022-09-15 LAB — HEMOGLOBIN A1C
Hgb A1c MFr Bld: 8.1 % of total Hgb — ABNORMAL HIGH (ref <5.7)
Mean Plasma Glucose: 186 mg/dL
eAG (mmol/L): 10.3 mmol/L

## 2022-09-15 LAB — PROTEIN / CREATININE RATIO, URINE
Creatinine, Urine: 95 mg/dL (ref 20–275)
Protein/Creat Ratio: 137 mg/g creat (ref 24–184)
Protein/Creatinine Ratio: 0.137 mg/mg creat (ref 0.024–0.184)
Total Protein, Urine: 13 mg/dL (ref 5–24)

## 2022-09-16 ENCOUNTER — Other Ambulatory Visit: Payer: Self-pay

## 2022-09-18 NOTE — Telephone Encounter (Signed)
Spoke to patient to notify form has been completed and is ready for pickup. Original placed in filing folder at front desk, and copy sent to scan center. Patient stated she will pick up form on Monday.

## 2022-10-14 ENCOUNTER — Other Ambulatory Visit: Payer: Self-pay | Admitting: Family Medicine

## 2022-10-14 NOTE — Telephone Encounter (Signed)
Requested Prescriptions  Pending Prescriptions Disp Refills   atorvastatin (LIPITOR) 20 MG tablet [Pharmacy Med Name: ATORVASTATIN 20 MG TABLET] 90 tablet 3    Sig: TAKE 1 TABLET (20 MG TOTAL) BY MOUTH DAILY. FOLLOW UP APPT REQUIRED     Cardiovascular:  Antilipid - Statins Failed - 10/14/2022  1:04 AM      Failed - Valid encounter within last 12 months    Recent Outpatient Visits           1 year ago Tubular adenoma   [facility] Donita Brooks, MD   2 years ago Controlled diabetes mellitus type 2 with complications, unspecified whether long term insulin use (HCC)   [facility] Donita Brooks, MD   2 years ago Controlled diabetes mellitus type 2 with complications, unspecified whether long term insulin use (HCC)   [facility] Donita Brooks, MD   3 years ago Benign essential HTN   [facility] Donita Brooks, MD   3 years ago Cellulitis of left arm   [facility] Pickard, Priscille Heidelberg, MD       Future Appointments             In 11 months Pickard, Priscille Heidelberg, MD [facility], [facility]            Failed - Lipid Panel in normal range within the last 12 months    Cholesterol  Date Value Ref Range Status  09/14/2022 144 <200 mg/dL Final   LDL Cholesterol (Calc)  Date Value Ref Range Status  09/14/2022 74 mg/dL (calc) Final    Comment:    Reference range: <100 . Desirable range <100 mg/dL for primary prevention;   <70 mg/dL for patients with CHD or diabetic patients  with > or = 2 CHD risk factors. Marland Kitchen LDL-C is now calculated using the Martin-Hopkins  calculation, which is a validated novel method providing  better accuracy than the Friedewald equation in the  estimation of LDL-C.  Horald Pollen et al. Lenox Ahr. 1610;960(45): 2061-2068  (http://education.[employer].com/faq/FAQ164)    HDL  Date Value Ref Range Status  09/14/2022 42 (L) > OR = 50 mg/dL Final    Triglycerides  Date Value Ref Range Status  09/14/2022 185 (H) <150 mg/dL Final         Passed - Patient is not pregnant

## 2022-10-19 ENCOUNTER — Other Ambulatory Visit (HOSPITAL_COMMUNITY)
Admission: RE | Admit: 2022-10-19 | Discharge: 2022-10-19 | Disposition: A | Discharge status: Home / Self Care | Payer: Commercial Managed Care - PPO | Source: Ambulatory Visit | Attending: Nurse Practitioner | Admitting: Nurse Practitioner

## 2022-10-19 ENCOUNTER — Encounter: Payer: Self-pay | Admitting: Nurse Practitioner

## 2022-10-19 ENCOUNTER — Ambulatory Visit: Payer: Commercial Managed Care - PPO | Admitting: Nurse Practitioner

## 2022-10-19 VITALS — BP 128/84 | HR 98 | Ht 62.5 in | Wt 284.0 lb

## 2022-10-19 DIAGNOSIS — Z124 Encounter for screening for malignant neoplasm of cervix: Secondary | ICD-10-CM | POA: Insufficient documentation

## 2022-10-19 DIAGNOSIS — Z01419 Encounter for gynecological examination (general) (routine) without abnormal findings: Secondary | ICD-10-CM | POA: Diagnosis not present

## 2022-10-19 DIAGNOSIS — Z780 Asymptomatic menopausal state: Secondary | ICD-10-CM

## 2022-10-19 NOTE — Progress Notes (Signed)
   Kristine Blackburn [birth year] [id number]   History:  62 y.o. G3P0030 presents as new patient to establish care. No GYN complaints. Postmenopausal - no HRT, no bleeding. Normal pap history. T2DM, HTN, anxiety, depression managed by PCP.   Gynecologic History Patient's last menstrual period was 01/27/2011.   Contraception/Family planning: post menopausal status Sexually active: No  Health Maintenance Last Pap: 02/08/2018. Results were: Normal neg HPV Last mammogram: 07/30/2022. Results were: Normal Last colonoscopy: 08/20/2015. Results were: Tubular adenoma Last Dexa: Never  Past medical history, past surgical history, family history and social history were all reviewed and documented in the EPIC chart. Married. 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 Mother deceased from ovarian cancer in her late 39s.   ROS:  A ROS was performed and pertinent positives and negatives are included.  Exam:  Vitals:   10/19/22 0922  BP: 128/84  Pulse: 98  SpO2: 96%  Weight: 284 lb (128.8 kg)  Height: 5' 2.5" (1.588 m)   Body mass index is 51.12 kg/m.  General appearance:  Normal Thyroid:  Symmetrical, normal in size, without palpable masses or nodularity. Respiratory  Auscultation:  Clear without wheezing or rhonchi Cardiovascular  Auscultation:  Regular rate, without rubs, murmurs or gallops  Edema/varicosities:  Not grossly evident Abdominal  Soft,nontender, without masses, guarding or rebound.  Liver/spleen:  No organomegaly noted  Hernia:  umbilical hernia present  Skin  Inspection:  Grossly normal Breasts: Examined lying and sitting.   Right: Without masses, retractions, nipple discharge or axillary adenopathy.   Left: Without masses, retractions, nipple discharge or axillary adenopathy. Genitourinary   Inguinal/mons:  Normal without inguinal adenopathy  External genitalia:  Normal appearing vulva with no masses, tenderness, or lesions  BUS/Urethra/Skene's glands:  Normal  Vagina:   Normal appearing with normal color and discharge, no lesions  Cervix:  Normal appearing without discharge or lesions  Uterus:  Difficult to palpate due to body habitus but no gross masses or tenderness  Adnexa/parametria:     Rt: Normal in size, without masses or tenderness.   Lt: Normal in size, without masses or tenderness.  Anus and perineum: Normal  Digital rectal exam: Deferred  Patient informed chaperone available to be present for breast and pelvic exam. Patient has requested no chaperone to be present. Patient has been advised what will be completed during breast and pelvic exam.   Assessment/Plan:  62 y.o. G3P0030 to establish care.   Well female exam with routine gynecological exam - Education provided on SBEs, importance of preventative screenings, current guidelines, high calcium diet, regular exercise, and multivitamin daily.  Labs with PCP.   Postmenopausal - no HRT, no bleeding  Screening for cervical cancer - Plan: Cytology - PAP( [place]). Normal pap history.   Screening for breast cancer - Normal mammogram history.  Continue annual screenings.  Normal breast exam today.  Screening for colon cancer - Due for colonoscopy. Had scheduled but due to insurance issues, etc needs to reschedule.   Screening for osteoporosis - Average risk. Will plan DXA at age 55.   Return in 1 year for annual.     Olivia Mackie DNP, 9:47 AM 10/19/2022

## 2022-10-22 LAB — CYTOLOGY - PAP
Comment: NEGATIVE
Diagnosis: NEGATIVE
High risk HPV: NEGATIVE

## 2022-11-18 ENCOUNTER — Other Ambulatory Visit: Payer: Self-pay | Admitting: Family Medicine

## 2022-11-18 NOTE — Telephone Encounter (Signed)
Due to a glitch in the system the LOV is not picked up by the protocol for this practice.   LOV 09/14/2022.  Lab in date.  Refill given #60, 5 refills.  Requested Prescriptions  Pending Prescriptions Disp Refills   metFORMIN (GLUCOPHAGE) 500 MG tablet [Pharmacy Med Name: METFORMIN HCL 500 MG TABLET] 60 tablet 5    Sig: TAKE 1 TABLET BY MOUTH TWICE A DAY WITH FOOD     Endocrinology:  Diabetes - Biguanides Failed - 11/18/2022  2:20 AM      Failed - HBA1C is between 0 and 7.9 and within 180 days    Hgb A1c MFr Bld  Date Value Ref Range Status  09/14/2022 8.1 (H) <5.7 % of total Hgb Final    Comment:    For someone without known diabetes, a hemoglobin A1c value of 6.5% or greater indicates that they may have  diabetes and this should be confirmed with a follow-up  test. . For someone with known diabetes, a value <7% indicates  that their diabetes is well controlled and a value  greater than or equal to 7% indicates suboptimal  control. A1c targets should be individualized based on  duration of diabetes, age, comorbid conditions, and  other considerations. . Currently, no consensus exists regarding use of hemoglobin A1c for diagnosis of diabetes for children. .          Failed - B12 Level in normal range and within 720 days    No results found for: "VITAMINB12"       Failed - Valid encounter within last 6 months    Recent Outpatient Visits           1 year ago Tubular adenoma   [facility] Medicine Donita Brooks, MD   2 years ago Controlled diabetes mellitus type 2 with complications, unspecified whether long term insulin use (HCC)   [facility] Medicine Donita Brooks, MD   2 years ago Controlled diabetes mellitus type 2 with complications, unspecified whether long term insulin use (HCC)   [facility] Tanya Nones, Priscille Heidelberg, MD   3 years ago Benign essential HTN   [facility] Medicine Donita Brooks, MD   4 years ago Cellulitis of  left arm   [facility] Medicine Pickard, Priscille Heidelberg, MD       Future Appointments             In 10 months Pickard, Priscille Heidelberg, MD [facility], [facility]            Passed - Cr in normal range and within 360 days    Creat  Date Value Ref Range Status  09/14/2022 0.88 0.50 - 1.05 mg/dL Final   Creatinine, Urine  Date Value Ref Range Status  09/14/2022 95 20 - 275 mg/dL Final         Passed - eGFR in normal range and within 360 days    GFR, Est African American  Date Value Ref Range Status  10/16/2020 82 > OR = 60 mL/min/1.73m2 Final   GFR, Est Non African American  Date Value Ref Range Status  10/16/2020 70 > OR = 60 mL/min/1.55m2 Final   eGFR  Date Value Ref Range Status  09/14/2022 74 > OR = 60 mL/min/1.64m2 Final         Passed - CBC within normal limits and completed in the last 12 months    WBC  Date Value Ref  Range Status  09/14/2022 8.2 3.8 - 10.8 Thousand/uL Final   RBC  Date Value Ref Range Status  09/14/2022 4.82 3.80 - 5.10 Million/uL Final   Hemoglobin  Date Value Ref Range Status  09/14/2022 13.7 11.7 - 15.5 g/dL Final   HCT  Date Value Ref Range Status  09/14/2022 43.2 35.0 - 45.0 % Final   MCHC  Date Value Ref Range Status  09/14/2022 31.7 (L) 32.0 - 36.0 g/dL Final   [facility]  Date Value Ref Range Status  09/14/2022 28.4 27.0 - 33.0 pg Final   MCV  Date Value Ref Range Status  09/14/2022 89.6 80.0 - 100.0 fL Final   No results found for: "PLTCOUNTKUC", "LABPLAT", "POCPLA" RDW  Date Value Ref Range Status  09/14/2022 14.5 11.0 - 15.0 % Final

## 2022-11-20 ENCOUNTER — Other Ambulatory Visit: Payer: Self-pay | Admitting: Family Medicine

## 2022-11-25 DIAGNOSIS — E1165 Type 2 diabetes mellitus with hyperglycemia: Secondary | ICD-10-CM | POA: Insufficient documentation

## 2022-11-27 LAB — HM DIABETES EYE EXAM

## 2023-01-07 ENCOUNTER — Other Ambulatory Visit: Payer: Self-pay | Admitting: Family Medicine

## 2023-01-16 DIAGNOSIS — S63502A Unspecified sprain of left wrist, initial encounter: Secondary | ICD-10-CM | POA: Insufficient documentation

## 2023-01-16 DIAGNOSIS — M79642 Pain in left hand: Secondary | ICD-10-CM | POA: Insufficient documentation

## 2023-01-16 DIAGNOSIS — M25432 Effusion, left wrist: Secondary | ICD-10-CM | POA: Insufficient documentation

## 2023-02-04 ENCOUNTER — Other Ambulatory Visit: Payer: Self-pay | Admitting: Family Medicine

## 2023-02-04 NOTE — Telephone Encounter (Signed)
Patient called to follow up on refill request as advised by pharmacist; patient stated last dose was 3MG  and it's time for a dose increase.  Ph[facility] confirmed as:  [id number] [place] [place] - [address] AT [facility] [address], [place] [address] Phone: [phone]  Fax: [phone] DEA #: [id number]   Please advise at [phone].

## 2023-02-05 NOTE — Telephone Encounter (Signed)
Requested medication (s) are due for refill today: Yes  Requested medication (s) are on the active medication list: Yes  Last refill:  11/20/22 #30, 1RF  Future visit scheduled: Yes  Notes to clinic:  Unable to refill per protocol, medication not assigned to the refill protocol.      Requested Prescriptions  Pending Prescriptions Disp Refills   RYBELSUS 3 MG TABS [Pharmacy Med Name: RYBELSUS 3 MG TABLET] 30 tablet 1    Sig: TAKE 1 TABLET (3 MG TOTAL) BY MOUTH DAILY. QUIT JANUVIA     Off-Protocol Failed - 02/04/2023  2:57 PM      Failed - Medication not assigned to a protocol, review manually.      Failed - Valid encounter within last 12 months    Recent Outpatient Visits           1 year ago Tubular adenoma   [facility] Donita Brooks, MD   2 years ago Controlled diabetes mellitus type 2 with complications, unspecified whether long term insulin use (HCC)   [facility] Medicine Donita Brooks, MD   3 years ago Controlled diabetes mellitus type 2 with complications, unspecified whether long term insulin use (HCC)   [facility] Medicine Donita Brooks, MD   3 years ago Benign essential HTN   [facility] Donita Brooks, MD   4 years ago Cellulitis of left arm   [facility] Medicine Pickard, Priscille Heidelberg, MD       Future Appointments             In 7 months Pickard, Priscille Heidelberg, MD [facility], [facility]

## 2023-02-05 NOTE — Telephone Encounter (Signed)
Requested medication (s) are due for refill today: routing for review  Requested medication (s) are on the active medication list: yes  Last refill:  11/20/22  Future visit scheduled: yes  Notes to clinic:  Medication not assigned to a protocol, review manually. Patient states that she due for a dose increase, routing for approval.     Requested Prescriptions  Pending Prescriptions Disp Refills   RYBELSUS 3 MG TABS [Pharmacy Med Name: RYBELSUS 3 MG TABLET] 30 tablet 1    Sig: TAKE 1 TABLET (3 MG TOTAL) BY MOUTH DAILY. QUIT JANUVIA     Off-Protocol Failed - 02/04/2023  2:57 PM      Failed - Medication not assigned to a protocol, review manually.      Failed - Valid encounter within last 12 months    Recent Outpatient Visits           1 year ago Tubular adenoma   [facility] Donita Brooks, MD   2 years ago Controlled diabetes mellitus type 2 with complications, unspecified whether long term insulin use (HCC)   [facility] Donita Brooks, MD   3 years ago Controlled diabetes mellitus type 2 with complications, unspecified whether long term insulin use (HCC)   [facility] Medicine Donita Brooks, MD   3 years ago Benign essential HTN   [facility] Medicine Donita Brooks, MD   4 years ago Cellulitis of left arm   [facility] Medicine Pickard, Priscille Heidelberg, MD       Future Appointments             In 7 months Pickard, Priscille Heidelberg, MD [facility], [facility]

## 2023-02-09 NOTE — Telephone Encounter (Signed)
LOV 09/14/22 (cpe)  Patient completely out of RYBELSUS 3 MG TABS .  Patient stated pharmacist waiting for reply from this office.  Pharmacy confirmed as:  [id number] - [place], [place] - [address] AT [facility] [address], [place] [address] Phone: [phone]  Fax: [phone] DEA #: [id number]   Please advise pharmacist.

## 2023-02-16 ENCOUNTER — Telehealth: Payer: Self-pay

## 2023-02-16 NOTE — Telephone Encounter (Signed)
Pt states she went to pick up her Rybelsus and it is $1,000.00 per month. Pt asks if there is an alternative? Pt states she was previously on Januvia.

## 2023-02-19 ENCOUNTER — Other Ambulatory Visit: Payer: Self-pay

## 2023-02-19 DIAGNOSIS — E118 Type 2 diabetes mellitus with unspecified complications: Secondary | ICD-10-CM

## 2023-02-19 MED ORDER — GLIPIZIDE ER 5 MG PO TB24
5.0000 mg | ORAL_TABLET | Freq: Every day | ORAL | 1 refills | Status: DC
Start: 2023-02-19 — End: 2023-07-12

## 2023-03-04 ENCOUNTER — Other Ambulatory Visit: Payer: Self-pay | Admitting: Family Medicine

## 2023-03-05 NOTE — Telephone Encounter (Signed)
Requested medications are due for refill today.  yes  Requested medications are on the active medications list.  yes  Last refill. 01/07/2023 #60 0 rf  Future visit scheduled.   yes  Notes to clinic.  Pt last seen 09/14/2022. Last refill was called a "courtesy refill". Please advise.    Requested Prescriptions  Pending Prescriptions Disp Refills   escitalopram (LEXAPRO) 10 MG tablet [Pharmacy Med Name: ESCITALOPRAM 10 MG TABLET] 60 tablet 0    Sig: TAKE 1 TABLET BY MOUTH EVERY DAY     Psychiatry:  Antidepressants - SSRI Failed - 03/04/2023  2:33 AM      Failed - Valid encounter within last 6 months    Recent Outpatient Visits           1 year ago Tubular adenoma   [facility] Donita Brooks, MD   2 years ago Controlled diabetes mellitus type 2 with complications, unspecified whether long term insulin use (HCC)   [facility] Medicine Donita Brooks, MD   3 years ago Controlled diabetes mellitus type 2 with complications, unspecified whether long term insulin use (HCC)   [facility] Medicine Donita Brooks, MD   3 years ago Benign essential HTN   [facility] Donita Brooks, MD   4 years ago Cellulitis of left arm   [facility] Pickard, Priscille Heidelberg, MD       Future Appointments             In 6 months Pickard, Priscille Heidelberg, MD [facility], [facility]            Passed - Completed PHQ-2 or PHQ-9 in the last 360 days

## 2023-03-11 ENCOUNTER — Other Ambulatory Visit: Payer: Self-pay

## 2023-03-11 DIAGNOSIS — F32A Depression, unspecified: Secondary | ICD-10-CM

## 2023-03-11 MED ORDER — ESCITALOPRAM OXALATE 10 MG PO TABS
10.0000 mg | ORAL_TABLET | Freq: Every day | ORAL | 1 refills | Status: DC
Start: 2023-03-11 — End: 2023-07-09

## 2023-03-11 NOTE — Telephone Encounter (Signed)
Patient called to follow up on refill requested for escitalopram (LEXAPRO) 10 MG tablet   LOV 09/14/22 (cpe)   Pharmacy confirmed as:   [id number] - [place], [place] - [address] AT [facility] [address], [place] [address] Phone: [phone]  Fax: [phone] DEA #: [id number]   Please advise patient at [phone].

## 2023-03-25 ENCOUNTER — Other Ambulatory Visit: Payer: Commercial Managed Care - PPO

## 2023-03-26 ENCOUNTER — Other Ambulatory Visit: Payer: Commercial Managed Care - PPO

## 2023-03-27 LAB — HEMOGLOBIN A1C
Hgb A1c MFr Bld: 8.5 %{Hb} — ABNORMAL HIGH (ref <5.7)
Mean Plasma Glucose: 197 mg/dL
eAG (mmol/L): 10.9 mmol/L

## 2023-03-27 LAB — LIPID PANEL
Cholesterol: 128 mg/dL (ref <200)
HDL: 43 mg/dL — ABNORMAL LOW (ref >=50)
LDL Cholesterol (Calc): 59 mg/dL
Non-HDL Cholesterol (Calc): 85 mg/dL (ref <130)
Total CHOL/HDL Ratio: 3 (calc) (ref <5.0)
Triglycerides: 187 mg/dL — ABNORMAL HIGH (ref <150)

## 2023-03-27 LAB — CBC WITH DIFFERENTIAL/PLATELET
Absolute Monocytes: 628 {cells}/uL (ref 200–950)
Basophils Absolute: 43 {cells}/uL (ref 0–200)
Basophils Relative: 0.5 %
Eosinophils Absolute: 77 {cells}/uL (ref 15–500)
Eosinophils Relative: 0.9 %
HCT: 42.2 % (ref 35.0–45.0)
Hemoglobin: 13.3 g/dL (ref 11.7–15.5)
Lymphs Abs: 3062 {cells}/uL (ref 850–3900)
MCH: 28.5 pg (ref 27.0–33.0)
MCHC: 31.5 g/dL — ABNORMAL LOW (ref 32.0–36.0)
MCV: 90.6 fL (ref 80.0–100.0)
MPV: 11.5 fL (ref 7.5–12.5)
Monocytes Relative: 7.3 %
Neutro Abs: 4790 {cells}/uL (ref 1500–7800)
Neutrophils Relative %: 55.7 %
Platelets: 243 10*3/uL (ref 140–400)
RBC: 4.66 10*6/uL (ref 3.80–5.10)
RDW: 14.6 % (ref 11.0–15.0)
Total Lymphocyte: 35.6 %
WBC: 8.6 10*3/uL (ref 3.8–10.8)

## 2023-03-27 LAB — COMPLETE METABOLIC PANEL WITH GFR
AG Ratio: 1.2 (calc) (ref 1.0–2.5)
ALT: 18 U/L (ref 6–29)
AST: 17 U/L (ref 10–35)
Albumin: 3.4 g/dL — ABNORMAL LOW (ref 3.6–5.1)
Alkaline phosphatase (APISO): 85 U/L (ref 37–153)
BUN: 12 mg/dL (ref 7–25)
CO2: 33 mmol/L — ABNORMAL HIGH (ref 20–32)
Calcium: 9.2 mg/dL (ref 8.6–10.4)
Chloride: 99 mmol/L (ref 98–110)
Creat: 0.84 mg/dL (ref 0.50–1.05)
Globulin: 2.9 g/dL (ref 1.9–3.7)
Glucose, Bld: 161 mg/dL — ABNORMAL HIGH (ref 65–99)
Potassium: 4.1 mmol/L (ref 3.5–5.3)
Sodium: 142 mmol/L (ref 135–146)
Total Bilirubin: 0.6 mg/dL (ref 0.2–1.2)
Total Protein: 6.3 g/dL (ref 6.1–8.1)
eGFR: 79 mL/min/{1.73_m2} (ref >=60)

## 2023-03-27 LAB — MICROALBUMIN / CREATININE URINE RATIO
Creatinine, Urine: 171 mg/dL (ref 20–275)
Microalb Creat Ratio: 6 mg/g{creat} (ref <30)
Microalb, Ur: 1.1 mg/dL

## 2023-03-27 LAB — VITAMIN B12: Vitamin B-12: 204 pg/mL (ref 200–1100)

## 2023-05-05 ENCOUNTER — Other Ambulatory Visit: Payer: Self-pay | Admitting: Family Medicine

## 2023-05-06 NOTE — Telephone Encounter (Signed)
Requested medication (s) are due for refill today: yes  Requested medication (s) are on the active medication list: yes  Last refill:  11/18/22 #60 5 RF  Future visit scheduled: yes  Notes to clinic:  last Hgb A1C abnormal   Requested Prescriptions  Pending Prescriptions Disp Refills   metFORMIN (GLUCOPHAGE) 500 MG tablet [Pharmacy Med Name: METFORMIN HCL 500 MG TABLET] 60 tablet 5    Sig: TAKE 1 TABLET BY MOUTH TWICE A DAY WITH FOOD     Endocrinology:  Diabetes - Biguanides Failed - 05/05/2023  1:48 AM      Failed - HBA1C is between 0 and 7.9 and within 180 days    Hgb A1c MFr Bld  Date Value Ref Range Status  03/26/2023 8.5 (H) <5.7 % of total Hgb Final    Comment:    For someone without known diabetes, a hemoglobin A1c value of 6.5% or greater indicates that they may have  diabetes and this should be confirmed with a follow-up  test. . For someone with known diabetes, a value <7% indicates  that their diabetes is well controlled and a value  greater than or equal to 7% indicates suboptimal  control. A1c targets should be individualized based on  duration of diabetes, age, comorbid conditions, and  other considerations. . Currently, no consensus exists regarding use of hemoglobin A1c for diagnosis of diabetes for children. .          Failed - Valid encounter within last 6 months    Recent Outpatient Visits           1 year ago Tubular adenoma   [facility] Donita Brooks, MD   2 years ago Controlled diabetes mellitus type 2 with complications, unspecified whether long term insulin use (HCC)   [facility] Donita Brooks, MD   3 years ago Controlled diabetes mellitus type 2 with complications, unspecified whether long term insulin use (HCC)   [facility] Medicine Donita Brooks, MD   4 years ago Benign essential HTN   [facility] Donita Brooks, MD   4 years ago Cellulitis of left arm   [facility] Medicine Pickard, Priscille Heidelberg, MD       Future Appointments             In 4 months Pickard, Priscille Heidelberg, MD [facility], [facility]            Passed - Cr in normal range and within 360 days    Creat  Date Value Ref Range Status  03/26/2023 0.84 0.50 - 1.05 mg/dL Final   Creatinine, Urine  Date Value Ref Range Status  03/26/2023 171 20 - 275 mg/dL Final         Passed - eGFR in normal range and within 360 days    GFR, Est African American  Date Value Ref Range Status  10/16/2020 82 > OR = 60 mL/min/1.73m2 Final   GFR, Est Non African American  Date Value Ref Range Status  10/16/2020 70 > OR = 60 mL/min/1.38m2 Final   eGFR  Date Value Ref Range Status  03/26/2023 79 > OR = 60 mL/min/1.78m2 Final         Passed - B12 Level in normal range and within 720 days    Vitamin B-12  Date Value Ref Range Status  03/26/2023 204 200 - 1,100 pg/mL Final    Comment:    .  Please Note: Although the reference range for vitamin B12 is [phone] pg/mL, it has been reported that between 5 and 10% of patients with values between 200 and 400 pg/mL may experience neuropsychiatric and hematologic abnormalities due to occult B12 deficiency; less than 1% of patients with values above 400 pg/mL will have symptoms. .          Passed - CBC within normal limits and completed in the last 12 months    WBC  Date Value Ref Range Status  03/26/2023 8.6 3.8 - 10.8 Thousand/uL Final   RBC  Date Value Ref Range Status  03/26/2023 4.66 3.80 - 5.10 Million/uL Final   Hemoglobin  Date Value Ref Range Status  03/26/2023 13.3 11.7 - 15.5 g/dL Final   HCT  Date Value Ref Range Status  03/26/2023 42.2 35.0 - 45.0 % Final   MCHC  Date Value Ref Range Status  03/26/2023 31.5 (L) 32.0 - 36.0 g/dL Final    Comment:    For adults, a slight decrease in the calculated MCHC value (in the range of 30 to 32 g/dL) is most likely not clinically significant; however, it  should be interpreted with caution in correlation with other red cell parameters and the patient's clinical condition.    [facility]  Date Value Ref Range Status  03/26/2023 28.5 27.0 - 33.0 pg Final   MCV  Date Value Ref Range Status  03/26/2023 90.6 80.0 - 100.0 fL Final   No results found for: "PLTCOUNTKUC", "LABPLAT", "POCPLA" RDW  Date Value Ref Range Status  03/26/2023 14.6 11.0 - 15.0 % Final

## 2023-05-19 ENCOUNTER — Other Ambulatory Visit: Payer: Self-pay | Admitting: Family Medicine

## 2023-05-19 ENCOUNTER — Telehealth: Payer: Self-pay

## 2023-05-19 NOTE — Telephone Encounter (Signed)
Prescription Request  05/19/2023  LOV: 09/14/2022  What is the name of the medication or equipment? lisinopril-hydrochlorothiazide (ZESTORETIC) 20-12.5 MG tablet   Have you contacted your pharmacy to request a refill? Yes   Which pharmacy would you like this sent to?  [id number] - [place], [place] - [address] AT [facility] [address] [place] [address] Phone: [phone] Fax: [phone]    Patient notified that their request is being sent to the clinical staff for review and that they should receive a response within 2 business days.   Please advise at [facility] [phone]

## 2023-05-19 NOTE — Telephone Encounter (Signed)
PA-for Rybelsus has been sent to plan

## 2023-05-19 NOTE — Telephone Encounter (Signed)
Prescription Request  05/19/2023  LOV: 09/14/2022  What is the name of the medication or equipment? RYBELSUS 3 MG TABS   Have you contacted your pharmacy to request a refill? Yes   Which pharmacy would you like this sent to?  [id number] - [place], [place] - [address] AT [facility] [address] [place] [address] Phone: [phone] Fax: [phone]    Patient notified that their request is being sent to the clinical staff for review and that they should receive a response within 2 business days.   Please advise at [facility] [phone]

## 2023-05-20 MED ORDER — RYBELSUS 3 MG PO TABS: 3.0000 mg | ORAL_TABLET | Freq: Every day | ORAL | 1 refills | Status: DC

## 2023-05-20 NOTE — Telephone Encounter (Signed)
Requested Prescriptions  Pending Prescriptions Disp Refills   Semaglutide (RYBELSUS) 3 MG TABS 30 tablet 1    Sig: Take 1 tablet (3 mg total) by mouth daily. Quit [place]     Off-Protocol Failed - 05/19/2023  2:08 PM      Failed - Medication not assigned to a protocol, review manually.      Failed - Valid encounter within last 12 months    Recent Outpatient Visits           1 year ago Tubular adenoma   [facility] Donita Brooks, MD   2 years ago Controlled diabetes mellitus type 2 with complications, unspecified whether long term insulin use (HCC)   [facility] Donita Brooks, MD   3 years ago Controlled diabetes mellitus type 2 with complications, unspecified whether long term insulin use (HCC)   [facility] Medicine Donita Brooks, MD   4 years ago Benign essential HTN   [facility] Medicine Donita Brooks, MD   4 years ago Cellulitis of left arm   [facility] Medicine Pickard, Priscille Heidelberg, MD       Future Appointments             In 4 months Pickard, Priscille Heidelberg, MD [facility], [facility]

## 2023-05-21 ENCOUNTER — Other Ambulatory Visit: Payer: Self-pay

## 2023-05-21 DIAGNOSIS — I10 Essential (primary) hypertension: Secondary | ICD-10-CM

## 2023-05-21 MED ORDER — LISINOPRIL-HYDROCHLOROTHIAZIDE 20-12.5 MG PO TABS: 1.0000 | ORAL_TABLET | Freq: Every day | ORAL | 3 refills | Status: DC

## 2023-05-21 NOTE — Telephone Encounter (Signed)
Patient requesting refills. Receipt confirmed by pharmacy 05/21/23 at 10:31 am. Duplicate request. Future visit in 3 months.  Requested Prescriptions  Refused Prescriptions Disp Refills   lisinopril-hydrochlorothiazide (ZESTORETIC) 20-12.5 MG tablet 90 tablet 3    Sig: Take 1 tablet by mouth daily.     Cardiovascular:  ACEI + Diuretic Combos Failed - 05/19/2023  1:52 PM      Failed - Valid encounter within last 6 months    Recent Outpatient Visits           1 year ago Tubular adenoma   [facility] Donita Brooks, MD   2 years ago Controlled diabetes mellitus type 2 with complications, unspecified whether long term insulin use (HCC)   [facility] Medicine Pickard, Priscille Heidelberg, MD   3 years ago Controlled diabetes mellitus type 2 with complications, unspecified whether long term insulin use (HCC)   [facility] Donita Brooks, MD   4 years ago Benign essential HTN   [facility] Donita Brooks, MD   4 years ago Cellulitis of left arm   [facility] Medicine Pickard, Priscille Heidelberg, MD       Future Appointments             In 3 months Pickard, Priscille Heidelberg, MD [facility], [facility]            Passed - Na in normal range and within 180 days    Sodium  Date Value Ref Range Status  03/26/2023 142 135 - 146 mmol/L Final         Passed - K in normal range and within 180 days    Potassium  Date Value Ref Range Status  03/26/2023 4.1 3.5 - 5.3 mmol/L Final         Passed - Cr in normal range and within 180 days    Creat  Date Value Ref Range Status  03/26/2023 0.84 0.50 - 1.05 mg/dL Final   Creatinine, Urine  Date Value Ref Range Status  03/26/2023 171 20 - 275 mg/dL Final         Passed - eGFR is 30 or above and within 180 days    GFR, Est African American  Date Value Ref Range Status  10/16/2020 82 > OR = 60 mL/min/1.42m2 Final   GFR, Est Non African American  Date Value Ref Range  Status  10/16/2020 70 > OR = 60 mL/min/1.64m2 Final   eGFR  Date Value Ref Range Status  03/26/2023 79 > OR = 60 mL/min/1.15m2 Final         Passed - Patient is not pregnant      Passed - Last BP in normal range    BP Readings from Last 1 Encounters:  10/19/22 128/84

## 2023-05-25 NOTE — Telephone Encounter (Signed)
PA-for Rybelsus has been DENIED  PA Case: [id number], Status: Denied. Notification: Completed.

## 2023-06-23 ENCOUNTER — Other Ambulatory Visit: Payer: Self-pay

## 2023-06-23 ENCOUNTER — Telehealth: Payer: Self-pay

## 2023-06-23 ENCOUNTER — Other Ambulatory Visit: Payer: Self-pay | Admitting: Family Medicine

## 2023-06-23 DIAGNOSIS — E118 Type 2 diabetes mellitus with unspecified complications: Secondary | ICD-10-CM

## 2023-06-23 MED ORDER — METFORMIN HCL 500 MG PO TABS: 500.0000 mg | ORAL_TABLET | Freq: Two times a day (BID) | ORAL | 5 refills | Status: DC

## 2023-06-23 NOTE — Telephone Encounter (Signed)
 Copied from CRM [id number]. Topic: Clinical - Medication Refill >> Jun 23, 2023  2:35 PM Deleta HERO wrote: Most Recent Primary Care Visit:  Provider: [facility]  Department[facility]  Visit Type: LAB  Date: 03/26/2023  Medication: metFORMIN  (GLUCOPHAGE ) 500 MG tablet  Has the patient contacted their pharmacy? Yes (Agent: If no, request that the patient contact the pharmacy for the refill. If patient does not wish to contact the pharmacy document the reason why and proceed with request.) (Agent: If yes, when and what did the pharmacy advise?)  Is this the correct pharmacy for this prescription? Yes If no, delete pharmacy and type the correct one.  This is the patient's preferred pharmacy:  [id number] [place], [place] - [address] AT [facility] [address] [place] [address] Phone: [phone] Fax: [phone]   Has the prescription been filled recently? No  Is the patient out of the medication? Yes  Has the patient been seen for an appointment in the last year OR does the patient have an upcoming appointment? Yes  Can we respond through MyChart? No  Agent: Please be advised that Rx refills may take up to 3 business days. We ask that you follow-up with your pharmacy.

## 2023-07-02 ENCOUNTER — Other Ambulatory Visit: Payer: Self-pay | Admitting: Family Medicine

## 2023-07-02 DIAGNOSIS — Z1231 Encounter for screening mammogram for malignant neoplasm of breast: Secondary | ICD-10-CM

## 2023-07-09 ENCOUNTER — Other Ambulatory Visit: Payer: Self-pay | Admitting: Family Medicine

## 2023-07-09 DIAGNOSIS — F32A Depression, unspecified: Secondary | ICD-10-CM

## 2023-07-10 ENCOUNTER — Other Ambulatory Visit: Payer: Self-pay | Admitting: Family Medicine

## 2023-07-10 DIAGNOSIS — E118 Type 2 diabetes mellitus with unspecified complications: Secondary | ICD-10-CM

## 2023-08-06 ENCOUNTER — Ambulatory Visit: Payer: Commercial Managed Care - PPO

## 2023-08-13 ENCOUNTER — Ambulatory Visit
Admission: RE | Admit: 2023-08-13 | Discharge: 2023-08-13 | Disposition: A | Discharge status: Home / Self Care | Payer: Commercial Managed Care - PPO | Source: Ambulatory Visit | Attending: Family Medicine | Admitting: Family Medicine

## 2023-08-13 DIAGNOSIS — Z1231 Encounter for screening mammogram for malignant neoplasm of breast: Secondary | ICD-10-CM

## 2023-09-17 ENCOUNTER — Ambulatory Visit: Payer: Commercial Managed Care - PPO | Admitting: Family Medicine

## 2023-09-17 ENCOUNTER — Encounter: Payer: Self-pay | Admitting: Family Medicine

## 2023-09-17 VITALS — BP 138/78 | HR 81 | Temp 98.7°F | Ht 62.5 in | Wt 291.8 lb

## 2023-09-17 DIAGNOSIS — Z1211 Encounter for screening for malignant neoplasm of colon: Secondary | ICD-10-CM | POA: Diagnosis not present

## 2023-09-17 DIAGNOSIS — Z0000 Encounter for general adult medical examination without abnormal findings: Secondary | ICD-10-CM

## 2023-09-17 DIAGNOSIS — E118 Type 2 diabetes mellitus with unspecified complications: Secondary | ICD-10-CM

## 2023-09-17 DIAGNOSIS — Z7984 Long term (current) use of oral hypoglycemic drugs: Secondary | ICD-10-CM

## 2023-09-17 DIAGNOSIS — I10 Essential (primary) hypertension: Secondary | ICD-10-CM | POA: Diagnosis not present

## 2023-09-17 DIAGNOSIS — Z0001 Encounter for general adult medical examination with abnormal findings: Secondary | ICD-10-CM

## 2023-09-17 DIAGNOSIS — E1159 Type 2 diabetes mellitus with other circulatory complications: Secondary | ICD-10-CM | POA: Diagnosis not present
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
## 2023-09-18 LAB — COMPLETE METABOLIC PANEL WITHOUT GFR
AG Ratio: 1.3 (calc) (ref 1.0–2.5)
ALT: 18 U/L (ref 6–29)
AST: 14 U/L (ref 10–35)
Albumin: 3.7 g/dL (ref 3.6–5.1)
Alkaline phosphatase (APISO): 77 U/L (ref 37–153)
BUN: 14 mg/dL (ref 7–25)
CO2: 33 mmol/L — ABNORMAL HIGH (ref 20–32)
Calcium: 8.9 mg/dL (ref 8.6–10.4)
Chloride: 101 mmol/L (ref 98–110)
Creat: 0.77 mg/dL (ref 0.50–1.05)
Globulin: 2.9 g/dL (ref 1.9–3.7)
Glucose, Bld: 189 mg/dL — ABNORMAL HIGH (ref 65–99)
Potassium: 3.9 mmol/L (ref 3.5–5.3)
Sodium: 141 mmol/L (ref 135–146)
Total Bilirubin: 0.6 mg/dL (ref 0.2–1.2)
Total Protein: 6.6 g/dL (ref 6.1–8.1)

## 2023-09-18 LAB — CBC WITH DIFFERENTIAL/PLATELET
Absolute Lymphocytes: 2957 {cells}/uL (ref 850–3900)
Absolute Monocytes: 546 {cells}/uL (ref 200–950)
Basophils Absolute: 34 {cells}/uL (ref 0–200)
Basophils Relative: 0.4 %
Eosinophils Absolute: 34 {cells}/uL (ref 15–500)
Eosinophils Relative: 0.4 %
HCT: 45.1 % — ABNORMAL HIGH (ref 35.0–45.0)
Hemoglobin: 13.9 g/dL (ref 11.7–15.5)
MCH: 28.1 pg (ref 27.0–33.0)
MCHC: 30.8 g/dL — ABNORMAL LOW (ref 32.0–36.0)
MCV: 91.3 fL (ref 80.0–100.0)
MPV: 11.8 fL (ref 7.5–12.5)
Monocytes Relative: 6.5 %
Neutro Abs: 4830 {cells}/uL (ref 1500–7800)
Neutrophils Relative %: 57.5 %
Platelets: 256 10*3/uL (ref 140–400)
RBC: 4.94 10*6/uL (ref 3.80–5.10)
RDW: 14.8 % (ref 11.0–15.0)
Total Lymphocyte: 35.2 %
WBC: 8.4 10*3/uL (ref 3.8–10.8)

## 2023-09-18 LAB — HEMOGLOBIN A1C
Hgb A1c MFr Bld: 9 %{Hb} — ABNORMAL HIGH (ref <5.7)
Mean Plasma Glucose: 212 mg/dL
eAG (mmol/L): 11.7 mmol/L

## 2023-09-18 LAB — LIPID PANEL
Cholesterol: 137 mg/dL (ref <200)
HDL: 49 mg/dL — ABNORMAL LOW (ref >=50)
LDL Cholesterol (Calc): 65 mg/dL
Non-HDL Cholesterol (Calc): 88 mg/dL (ref <130)
Total CHOL/HDL Ratio: 2.8 (calc) (ref <5.0)
Triglycerides: 152 mg/dL — ABNORMAL HIGH (ref <150)

## 2023-09-21 ENCOUNTER — Telehealth: Payer: Self-pay

## 2023-09-21 ENCOUNTER — Other Ambulatory Visit: Payer: Self-pay

## 2023-09-21 ENCOUNTER — Other Ambulatory Visit: Payer: Self-pay | Admitting: Family Medicine

## 2023-09-21 DIAGNOSIS — E118 Type 2 diabetes mellitus with unspecified complications: Secondary | ICD-10-CM

## 2023-09-21 DIAGNOSIS — I10 Essential (primary) hypertension: Secondary | ICD-10-CM

## 2023-09-21 DIAGNOSIS — E785 Hyperlipidemia, unspecified: Secondary | ICD-10-CM

## 2023-09-21 DIAGNOSIS — E1165 Type 2 diabetes mellitus with hyperglycemia: Secondary | ICD-10-CM

## 2023-09-21 MED ORDER — TIRZEPATIDE 2.5 MG/0.5ML ~~LOC~~ SOAJ: 2.5000 mg | SUBCUTANEOUS | 1 refills | Status: DC

## 2023-09-21 MED ORDER — TRAMADOL HCL 50 MG PO TABS: 50.0000 mg | ORAL_TABLET | Freq: Four times a day (QID) | ORAL | 0 refills | Status: DC | PRN

## 2023-09-21 NOTE — Telephone Encounter (Signed)
 Pt called requesting refill on her Tramadol. Thanks.

## 2023-09-30 ENCOUNTER — Other Ambulatory Visit: Payer: Self-pay | Admitting: *Deleted

## 2023-09-30 ENCOUNTER — Telehealth: Payer: Self-pay | Admitting: *Deleted

## 2023-09-30 DIAGNOSIS — Z860100 Personal history of colon polyps, unspecified: Secondary | ICD-10-CM

## 2023-09-30 NOTE — Telephone Encounter (Signed)
 Gastroenterology Pre-Procedure Review  Request Date: 11/04/2023 Requesting Physician: Dr. Ole Berkeley  PATIENT REVIEW QUESTIONS: The patient responded to the following health history questions as indicated:    1. Are you having any GI issues? no 2. Do you have a personal history of Polyps? yes (last colonoscopy with Dr Ole Berkeley 08/20/2015) 3. Do you have a family history of Colon Cancer or Polyps? no 4. Diabetes Mellitus? yes (taking glipizide, metformin, Mounjaro) 5. Joint replacements in the past 12 months?no 6. Major health problems in the past 3 months?no 7. Any artificial heart valves, MVP, or defibrillator?no    MEDICATIONS & ALLERGIES:    Patient reports the following regarding taking any anticoagulation/antiplatelet therapy:   Plavix, Coumadin, Eliquis, Xarelto, Lovenox, Pradaxa, Brilinta, or Effient? no Aspirin? no  Patient confirms/reports the following medications:  Current Outpatient Medications  Medication Sig Dispense Refill   atorvastatin (LIPITOR) 20 MG tablet TAKE 1 TABLET (20 MG TOTAL) BY MOUTH DAILY. FOLLOW UP APPT REQUIRED 90 tablet 3   Cholecalciferol (VITAMIN D3) 5000 units CHEW Chew by mouth.     escitalopram (LEXAPRO) 10 MG tablet TAKE 1 TABLET BY MOUTH EVERY DAY 90 tablet 1   glipiZIDE (GLUCOTROL XL) 5 MG 24 hr tablet TAKE 1 TABLET BY MOUTH EVERY DAY WITH BREAKFAST 90 tablet 1   lisinopril-hydrochlorothiazide (ZESTORETIC) 20-12.5 MG tablet Take 1 tablet by mouth daily. 90 tablet 3   metFORMIN (GLUCOPHAGE) 500 MG tablet Take 1 tablet (500 mg total) by mouth 2 (two) times daily with a meal. 60 tablet 5   tirzepatide (MOUNJARO) 2.5 MG/0.5ML Pen Inject 2.5 mg into the skin once a week. 2 mL 1   traMADol (ULTRAM) 50 MG tablet Take 1 tablet (50 mg total) by mouth every 6 (six) hours as needed. 30 tablet 0   No current facility-administered medications for this visit.    Patient confirms/reports the following allergies:  Allergies  Allergen Reactions   Mustard Hives    No  orders of the defined types were placed in this encounter.   AUTHORIZATION INFORMATION Primary Insurance: 1D#: Group #:  Secondary Insurance: 1D#: Group #:  SCHEDULE INFORMATION: Date: 11/04/2023 Time: Location:  [facility]

## 2023-10-07 ENCOUNTER — Other Ambulatory Visit: Payer: Self-pay

## 2023-10-07 NOTE — Telephone Encounter (Signed)
 Prescription Request  10/07/2023  LOV: CPE 09/17/23  What is the name of the medication or equipment? atorvastatin  (LIPITOR) 20 MG tablet [[id number]]   Have you contacted your pharmacy to request a refill? Yes   Which pharmacy would you like this sent to?  [id number] - [place], [place] - [address] AT [facility] [address] [place] [address] Phone: [phone] Fax: [phone]    Patient notified that their request is being sent to the clinical staff for review and that they should receive a response within 2 business days.   Please advise at [facility] [phone]

## 2023-10-08 MED ORDER — ATORVASTATIN CALCIUM 20 MG PO TABS: 20.0000 mg | ORAL_TABLET | Freq: Every day | ORAL | 3 refills | Status: AC

## 2023-10-08 NOTE — Telephone Encounter (Signed)
 Requested Prescriptions  Pending Prescriptions Disp Refills   atorvastatin  (LIPITOR) 20 MG tablet 90 tablet 3    Sig: Take 1 tablet (20 mg total) by mouth daily. FOLLOW UP APPT REQUIRED     Cardiovascular:  Antilipid - Statins Failed - 10/08/2023  9:17 AM      Failed - Lipid Panel in normal range within the last 12 months    Cholesterol  Date Value Ref Range Status  09/17/2023 137 <200 mg/dL Final   LDL Cholesterol (Calc)  Date Value Ref Range Status  09/17/2023 65 mg/dL (calc) Final    Comment:    Reference range: <100 . Desirable range <100 mg/dL for primary prevention;   <70 mg/dL for patients with CHD or diabetic patients  with > or = 2 CHD risk factors. Aaron Aas LDL-C is now calculated using the Martin-Hopkins  calculation, which is a validated novel method providing  better accuracy than the Friedewald equation in the  estimation of LDL-C.  Melinda Sprawls et al. Erroll Heard. 7846;962(95): 2061-2068  (http://education.[employer].com/faq/FAQ164)    HDL  Date Value Ref Range Status  09/17/2023 49 (L) > OR = 50 mg/dL Final   Triglycerides  Date Value Ref Range Status  09/17/2023 152 (H) <150 mg/dL Final         Passed - Patient is not pregnant      Passed - Valid encounter within last 12 months    Recent Outpatient Visits           3 weeks ago Colon cancer screening   [facility] Austine Lefort, MD   1 year ago General medical exam   [facility] Pickard, Cisco Crest, MD

## 2023-11-03 ENCOUNTER — Telehealth: Payer: Self-pay

## 2023-11-03 ENCOUNTER — Encounter: Payer: Self-pay | Admitting: Gastroenterology

## 2023-11-03 MED ORDER — NA SULFATE-K SULFATE-MG SULF 17.5-3.13-1.6 GM/177ML PO SOLN: 1.0000 | Freq: Once | ORAL | 0 refills | Status: AC

## 2023-11-03 MED ORDER — SUTAB 1479-225-188 MG PO TABS: ORAL_TABLET | ORAL | 0 refills | Status: DC

## 2023-11-03 NOTE — Telephone Encounter (Signed)
 The patient called to request her prep instructions, as her procedure is scheduled for tomorrow. The pharmacy on file is [facility], located at [address] at [place], [place], [place] [address].

## 2023-11-03 NOTE — Telephone Encounter (Signed)
 Patient called office stating that we did not sent prep solution. Inform patient that when we were schuling the colonoscopy, patient stated that she did have prep solution that she did not use and it is not expire.  Today, patient stated that it is expire. I have sent Rx for Suprep and Sutab , patient will pick up the one, insurance will cover.  Advised patient to call back.

## 2023-11-04 ENCOUNTER — Encounter: Payer: Self-pay | Admitting: Gastroenterology

## 2023-11-04 ENCOUNTER — Ambulatory Visit: Admitting: General Practice

## 2023-11-04 ENCOUNTER — Ambulatory Visit
Admission: RE | Admit: 2023-11-04 | Discharge: 2023-11-04 | Disposition: A | Discharge status: Home / Self Care | Attending: Gastroenterology | Admitting: Gastroenterology

## 2023-11-04 ENCOUNTER — Encounter
Admission: RE | Disposition: A | Discharge status: Home / Self Care | Payer: Self-pay | Source: Home / Self Care | Attending: Gastroenterology

## 2023-11-04 DIAGNOSIS — Z6841 Body Mass Index (BMI) 40.0 and over, adult: Secondary | ICD-10-CM | POA: Diagnosis not present

## 2023-11-04 DIAGNOSIS — I10 Essential (primary) hypertension: Secondary | ICD-10-CM | POA: Insufficient documentation

## 2023-11-04 DIAGNOSIS — K640 First degree hemorrhoids: Secondary | ICD-10-CM | POA: Diagnosis not present

## 2023-11-04 DIAGNOSIS — F419 Anxiety disorder, unspecified: Secondary | ICD-10-CM | POA: Diagnosis not present

## 2023-11-04 DIAGNOSIS — K635 Polyp of colon: Secondary | ICD-10-CM

## 2023-11-04 DIAGNOSIS — Z87891 Personal history of nicotine dependence: Secondary | ICD-10-CM | POA: Diagnosis not present

## 2023-11-04 DIAGNOSIS — Z1211 Encounter for screening for malignant neoplasm of colon: Secondary | ICD-10-CM | POA: Diagnosis present

## 2023-11-04 DIAGNOSIS — F32A Depression, unspecified: Secondary | ICD-10-CM | POA: Insufficient documentation

## 2023-11-04 DIAGNOSIS — E119 Type 2 diabetes mellitus without complications: Secondary | ICD-10-CM | POA: Diagnosis not present

## 2023-11-04 DIAGNOSIS — Z860100 Personal history of colon polyps, unspecified: Secondary | ICD-10-CM

## 2023-11-04 DIAGNOSIS — K5730 Diverticulosis of large intestine without perforation or abscess without bleeding: Secondary | ICD-10-CM | POA: Insufficient documentation

## 2023-11-04 DIAGNOSIS — Z7984 Long term (current) use of oral hypoglycemic drugs: Secondary | ICD-10-CM | POA: Diagnosis not present

## 2023-11-04 DIAGNOSIS — D123 Benign neoplasm of transverse colon: Secondary | ICD-10-CM | POA: Diagnosis not present

## 2023-11-04 DIAGNOSIS — Z79899 Other long term (current) drug therapy: Secondary | ICD-10-CM | POA: Insufficient documentation

## 2023-11-04 DIAGNOSIS — Z7985 Long-term (current) use of injectable non-insulin antidiabetic drugs: Secondary | ICD-10-CM | POA: Diagnosis not present

## 2023-11-04 DIAGNOSIS — E6689 Other obesity not elsewhere classified: Secondary | ICD-10-CM | POA: Insufficient documentation

## 2023-11-04 HISTORY — PX: COLONOSCOPY: SHX5424

## 2023-11-04 HISTORY — PX: POLYPECTOMY: SHX149

## 2023-11-04 LAB — GLUCOSE, CAPILLARY: Glucose-Capillary: 124 mg/dL — ABNORMAL HIGH (ref 70–99)

## 2023-11-04 SURGERY — COLONOSCOPY
Anesthesia: General

## 2023-11-04 MED ORDER — LIDOCAINE HCL (CARDIAC) PF 100 MG/5ML IV SOSY
PREFILLED_SYRINGE | INTRAVENOUS | Status: DC | PRN
  Administered 2023-11-04: 50 mg via INTRAVENOUS

## 2023-11-04 MED ORDER — PROPOFOL 10 MG/ML IV BOLUS
INTRAVENOUS | Status: DC | PRN
  Administered 2023-11-04 (×2): 30 mg via INTRAVENOUS
  Administered 2023-11-04: 120 mg via INTRAVENOUS
  Administered 2023-11-04: 30 mg via INTRAVENOUS

## 2023-11-04 MED ORDER — SODIUM CHLORIDE 0.9 % IV SOLN: INTRAVENOUS | Status: DC
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
## 2023-11-04 NOTE — Op Note (Signed)
 [facility] Gastroenterology Patient Name: Kristine Blackburn Procedure Date: 11/04/2023 8:05 AM MRN: [id number] Account #: [id number] Date of Birth: Dec 07, 1960 Admit Type: Outpatient Age: 63 Room: [facility] ENDO ROOM 4 Gender: Female Note Status: Finalized Instrument Name: Hyman Main [id number] Procedure:             Colonoscopy Indications:           High risk colon cancer surveillance: Personal history                         of colonic polyps Providers:             Marnee Sink MD, MD Referring MD:          Cisco Crest. Pickard (Referring MD) Medicines:             Propofol  per Anesthesia Complications:         No immediate complications. Procedure:             Pre-Anesthesia Assessment:                        - Prior to the procedure, a History and Physical was                         performed, and patient medications and allergies were                         reviewed. The patient's tolerance of previous                         anesthesia was also reviewed. The risks and benefits                         of the procedure and the sedation options and risks                         were discussed with the patient. All questions were                         answered, and informed consent was obtained. Prior                         Anticoagulants: The patient has taken no anticoagulant                         or antiplatelet agents. ASA Grade Assessment: II - A                         patient with mild systemic disease. After reviewing                         the risks and benefits, the patient was deemed in                         satisfactory condition to undergo the procedure.                        After obtaining informed consent, the colonoscope was  passed under direct vision. Throughout the procedure,                         the patient's blood pressure, pulse, and oxygen                         saturations were monitored continuously. The                          Colonoscope was introduced through the anus and                         advanced to the the cecum, identified by appendiceal                         orifice and ileocecal valve. The colonoscopy was                         performed without difficulty. The patient tolerated                         the procedure well. The quality of the bowel                         preparation was excellent. Findings:      The perianal and digital rectal examinations were normal.      A 3 mm polyp was found in the transverse colon. The polyp was sessile.       The polyp was removed with a cold snare. Resection and retrieval were       complete.      Multiple small-mouthed diverticula were found in the sigmoid colon and       descending colon.      Non-bleeding internal hemorrhoids were found during retroflexion. The       hemorrhoids were Grade I (internal hemorrhoids that do not prolapse). Impression:            - One 3 mm polyp in the transverse colon, removed with                         a cold snare. Resected and retrieved.                        - Diverticulosis in the sigmoid colon and in the                         descending colon.                        - Non-bleeding internal hemorrhoids. Recommendation:        - Discharge patient to home.                        - Resume previous diet.                        - Continue present medications.                        - Repeat colonoscopy in 7 years for surveillance. Procedure Code(s):     ---  Professional ---                        [phone], Colonoscopy, flexible; with removal of                         tumor(s), polyp(s), or other lesion(s) by snare                         technique Diagnosis Code(s):     --- Professional ---                        Z86.010, Personal history of colonic polyps                        D12.3, Benign neoplasm of transverse colon (hepatic                         flexure or splenic flexure) CPT copyright  2022 American Medical Association. All rights reserved. The codes documented in this report are preliminary and upon coder review may  be revised to meet current compliance requirements. Marnee Sink MD, MD 11/04/2023 8:32:42 AM This report has been signed electronically. Number of Addenda: 0 Note Initiated On: 11/04/2023 8:05 AM Scope Withdrawal Time: 0 hours 7 minutes 10 seconds  Total Procedure Duration: 0 hours 12 minutes 39 seconds  Estimated Blood Loss:  Estimated blood loss: none.      [facility]

## 2023-11-04 NOTE — Transfer of Care (Signed)
 Immediate Anesthesia Transfer of Care Note  Patient: Kristine Blackburn  Procedure(s) Performed: COLONOSCOPY POLYPECTOMY, INTESTINE  Patient Location: Endoscopy Unit  Anesthesia Type:General  Level of Consciousness: awake, alert , and oriented  Airway & Oxygen Therapy: Patient Spontanous Breathing  Post-op Assessment: Report given to RN, Post -op Vital signs reviewed and stable, and Patient moving all extremities  Post vital signs: Reviewed and stable  Last Vitals:  Vitals Value Taken Time  BP 121/74 11/04/23 0835  Temp    Pulse 79 11/04/23 0835  Resp 18 11/04/23 0835  SpO2 97 % 11/04/23 0835  Vitals shown include unfiled device data.  Last Pain:  Vitals:   11/04/23 0834  TempSrc:   PainSc: 0-No pain         Complications: No notable events documented.
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
## 2023-11-04 NOTE — Anesthesia Postprocedure Evaluation (Signed)
 Anesthesia Post Note  Patient: Hayzel Ruberg  Procedure(s) Performed: COLONOSCOPY POLYPECTOMY, INTESTINE  Patient location during evaluation: Endoscopy Anesthesia Type: General Level of consciousness: awake and alert Pain management: pain level controlled Vital Signs Assessment: post-procedure vital signs reviewed and stable Respiratory status: spontaneous breathing, nonlabored ventilation, respiratory function stable and patient connected to nasal cannula oxygen Cardiovascular status: blood pressure returned to baseline and stable Postop Assessment: no apparent nausea or vomiting Anesthetic complications: no  No notable events documented.   Last Vitals:  Vitals:   11/04/23 0750 11/04/23 0834  BP: (!) 147/73 121/74  Pulse: 77 78  Resp: 16 20  Temp: (!) 35.6 C (!) 35.8 C  SpO2: 97% 97%    Last Pain:  Vitals:   11/04/23 0834  TempSrc: Temporal  PainSc: 0-No pain                 Enrique Harvest

## 2023-11-05 ENCOUNTER — Encounter: Payer: Self-pay | Admitting: Gastroenterology

## 2023-11-05 LAB — SURGICAL PATHOLOGY

## 2023-11-09 ENCOUNTER — Ambulatory Visit: Payer: Self-pay | Admitting: Gastroenterology

## 2023-11-17 ENCOUNTER — Other Ambulatory Visit: Payer: Self-pay

## 2023-11-17 ENCOUNTER — Telehealth: Payer: Self-pay

## 2023-11-17 DIAGNOSIS — E785 Hyperlipidemia, unspecified: Secondary | ICD-10-CM

## 2023-11-17 DIAGNOSIS — E1165 Type 2 diabetes mellitus with hyperglycemia: Secondary | ICD-10-CM

## 2023-11-17 MED ORDER — TIRZEPATIDE 5 MG/0.5ML ~~LOC~~ SOAJ: 5.0000 mg | SUBCUTANEOUS | 0 refills | Status: DC

## 2023-11-17 NOTE — Telephone Encounter (Signed)
 Copied from CRM [id number]. Topic: Clinical - Prescription Issue >> Nov 17, 2023  2:06 PM Kristine Blackburn wrote: Reason for CRM: Patient called. States her tirzepatide (MOUNJARO) 2.5 MG/0.5ML Pen is out. Pharmacy does not have any refills but provider previously discussed increasing. Is not sure if he is going to increase but she needs something sent to pharmacy. Next dose due tomorrow. Thank You

## 2023-11-22 ENCOUNTER — Encounter: Payer: Self-pay | Admitting: Nurse Practitioner

## 2023-11-22 ENCOUNTER — Ambulatory Visit (INDEPENDENT_AMBULATORY_CARE_PROVIDER_SITE_OTHER): Admitting: Nurse Practitioner

## 2023-11-22 VITALS — BP 116/78 | HR 85 | Ht 62.75 in | Wt 285.0 lb

## 2023-11-22 DIAGNOSIS — Z780 Asymptomatic menopausal state: Secondary | ICD-10-CM

## 2023-11-22 DIAGNOSIS — Z01419 Encounter for gynecological examination (general) (routine) without abnormal findings: Secondary | ICD-10-CM

## 2023-11-22 DIAGNOSIS — Z1331 Encounter for screening for depression: Secondary | ICD-10-CM

## 2023-11-22 NOTE — Progress Notes (Signed)
Kristine Blackburn Apr 09, 1961 [id number]   History:  63 y.o. G3P0030 presents for annual exam. No GYN complaints. Postmenopausal - no HRT, no bleeding. Normal pap history. T2DM, HTN, anxiety, depression managed by PCP.   Gynecologic History Patient's last menstrual period was 01/27/2011.   Contraception/Family planning: post menopausal status Sexually active: No  Health Maintenance Last Pap: 10/19/2022. Results were: Normal neg HPV Last mammogram: 08/13/2023. Results were: Normal Last colonoscopy: 11/04/2023. Results were: Tubular adenoma, 7-year recall Last Dexa: Never     11/22/2023   12:03 PM  Depression screen PHQ 2/9  Decreased Interest 0  Down, Depressed, Hopeless 0  PHQ - 2 Score 0     Past medical history, past surgical history, family history and social history were all reviewed and documented in the EPIC chart. Married. 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 Mother deceased from ovarian cancer in her late 60s. Father in hospice care.   ROS:  A ROS was performed and pertinent positives and negatives are included.  Exam:  Vitals:   11/22/23 1157  BP: 116/78  Pulse: 85  SpO2: 97%  Weight: 285 lb (129.3 kg)  Height: 5' 2.75" (1.594 m)    Body mass index is 50.89 kg/m.  General appearance:  Normal Thyroid :  Symmetrical, normal in size, without palpable masses or nodularity. Respiratory  Auscultation:  Clear without wheezing or rhonchi Cardiovascular  Auscultation:  Regular rate, without rubs, murmurs or gallops  Edema/varicosities:  Not grossly evident Abdominal  Soft,nontender, without masses, guarding or rebound.  Liver/spleen:  No organomegaly noted  Hernia:  umbilical hernia present  Skin  Inspection:  Grossly normal Breasts: Examined lying and sitting.   Right: Without masses, retractions, nipple discharge or axillary adenopathy.   Left: Without masses, retractions, nipple discharge or axillary adenopathy. Pelvic: External genitalia:  no lesions               Urethra:  normal appearing urethra with no masses, tenderness or lesions              Bartholins and Skenes: normal                 Vagina: normal appearing vagina with normal color and discharge, no lesions              Cervix: no lesions Bimanual Exam:  Uterus:  no masses or tenderness              Adnexa: no mass, fullness, tenderness              Rectovaginal: Deferred              Anus:  normal, no lesions  Exam performed by Doria Garden, NP student with observation.   Assessment/Plan:  63 y.o. G3P0030 for annual exam.   Well female exam with routine gynecological exam - Education provided on SBEs, importance of preventative screenings, current guidelines, high calcium  diet, regular exercise, and multivitamin daily.  Labs with PCP.   Postmenopausal - no HRT, no bleeding  Screening for cervical cancer - Normal pap history. Will repeat at 5-year interval per guidelines.   Screening for breast cancer - Normal mammogram history.  Continue annual screenings.  Normal breast exam today.  Screening for colon cancer - 2025 colonoscopy. Will repeat at GI's recommended interval.   Screening for osteoporosis - Average risk. Will plan DXA at age 80.   Return in about 1 year (around 11/21/2024) for Annual.    Andee Bamberger DNP, 12:23 PM  11/22/2023  

## 2023-11-30 ENCOUNTER — Telehealth: Payer: Self-pay | Admitting: Family Medicine

## 2023-11-30 NOTE — Telephone Encounter (Signed)
 Patient dropped off a form from her job to be completed by the provider; stated it's required annually to receive credit for having her annual physical. Patient requesting for completed form to be faxed to the number on her form of [phone]. Also requesting for us  to call her to pick up the completed forms after they've been faxed to her employer.   Patient paid the $10 form completion fee today.   Paperwork given to provider's nurse.

## 2023-12-03 LAB — HM DIABETES EYE EXAM

## 2023-12-06 ENCOUNTER — Telehealth: Payer: Self-pay

## 2023-12-06 NOTE — Telephone Encounter (Addendum)
 Form successfully faxed to insurance company at [phone] with a confirmation time stamp of Jun/20/2025 2:38/22 PM.  Also successfully faxed to [facility] with a confirmation time stamp of Jun/23/2025 11:32:46.  Spoke with patient to advise; she will pick up originals this afternoon or tomorrow.  Paperwork in file drawer at front desk awaiting pickup.

## 2023-12-06 NOTE — Telephone Encounter (Signed)
 Copied from CRM [id number]. Topic: General - Other >> Dec 06, 2023  9:47 AM Delon DASEN wrote: Reason for CRM: Patient calling to see if form is ready for pick up or fax, please call to inform  [phone]

## 2023-12-11 ENCOUNTER — Other Ambulatory Visit: Payer: Self-pay | Admitting: Family Medicine

## 2023-12-11 DIAGNOSIS — E118 Type 2 diabetes mellitus with unspecified complications: Secondary | ICD-10-CM

## 2023-12-13 ENCOUNTER — Other Ambulatory Visit: Payer: Self-pay | Admitting: Family Medicine

## 2023-12-13 DIAGNOSIS — E1165 Type 2 diabetes mellitus with hyperglycemia: Secondary | ICD-10-CM

## 2023-12-13 DIAGNOSIS — E785 Hyperlipidemia, unspecified: Secondary | ICD-10-CM

## 2023-12-14 ENCOUNTER — Other Ambulatory Visit: Payer: Self-pay

## 2023-12-14 ENCOUNTER — Telehealth: Payer: Self-pay

## 2023-12-14 DIAGNOSIS — F32A Depression, unspecified: Secondary | ICD-10-CM

## 2023-12-14 MED ORDER — ESCITALOPRAM OXALATE 10 MG PO TABS: 10.0000 mg | ORAL_TABLET | Freq: Every day | ORAL | 1 refills | Status: DC

## 2023-12-14 NOTE — Telephone Encounter (Addendum)
 Prescription Request  12/14/2023  LOV: 10/06/23 cpe  What is the name of the medication or equipment? escitalopram  (LEXAPRO ) 10 MG tablet [[id number]]   Have you contacted your pharmacy to request a refill? Yes   Which pharmacy would you like this sent to?  [id number] - [place], [place] - [address] AT [facility] [address] [place] [address] Phone: [phone] Fax: [phone]    Patient notified that their request is being sent to the clinical staff for review and that they should receive a response within 2 business days.   Please advise at [facility] [phone]  Prescription Request  12/14/2023  LOV: 10/06/23 cpe  What is the name of the medication or equipment? tirzepatide  (MOUNJARO ) 5 MG/0.5ML Pen [[id number]]   Have you contacted your pharmacy to request a refill? Yes   Which pharmacy would you like this sent to?  [id number] - [place], [place] - [address] AT [facility] [address] [place] [address] Phone: [phone] Fax: [phone]    Patient notified that their request is being sent to the clinical staff for review and that they should receive a response within 2 business days.   Please advise at [facility] [phone]  Prescription Request  12/14/2023  LOV: 10/06/23 cpe  What is the name of the medication or equipment? glipiZIDE  (GLUCOTROL  XL) 5 MG 24 hr tablet [[id number]]   Have you contacted your pharmacy to request a refill? Yes   Which pharmacy would you like this sent to?  [id number] - [place], [place] - [address] AT [facility] [address] [place]  [address] Phone: [phone] Fax: [phone]    Patient notified that their request is being sent to the clinical staff for review and that they should receive a response within 2 business days.   Please advise at [facility] [phone]

## 2024-01-10 ENCOUNTER — Telehealth: Payer: Self-pay | Admitting: Family Medicine

## 2024-01-10 ENCOUNTER — Other Ambulatory Visit: Payer: Self-pay

## 2024-01-10 DIAGNOSIS — E1165 Type 2 diabetes mellitus with hyperglycemia: Secondary | ICD-10-CM

## 2024-01-10 DIAGNOSIS — E118 Type 2 diabetes mellitus with unspecified complications: Secondary | ICD-10-CM

## 2024-01-10 DIAGNOSIS — E785 Hyperlipidemia, unspecified: Secondary | ICD-10-CM

## 2024-01-10 MED ORDER — GLIPIZIDE ER 5 MG PO TB24: 5.0000 mg | ORAL_TABLET | Freq: Every day | ORAL | 1 refills | Status: AC

## 2024-01-10 MED ORDER — MOUNJARO 5 MG/0.5ML ~~LOC~~ SOAJ: 5.0000 mg | SUBCUTANEOUS | 0 refills | Status: DC

## 2024-01-10 NOTE — Telephone Encounter (Signed)
 Prescription Request  01/10/2024  LOV: 09/17/2023  What is the name of the medication or equipment?   tirzepatide  (MOUNJARO ) 5 MG/0.5ML Pen   Have you contacted your pharmacy to request a refill? Yes   Which pharmacy would you like this sent to?  [id number] - [place], [place] - [address] AT [facility] [address] [place]  [address] Phone: [phone] Fax: [phone]    Patient notified that their request is being sent to the clinical staff for review and that they should receive a response within 2 business days.   Please advise pharmacist.

## 2024-01-10 NOTE — Telephone Encounter (Signed)
 Prescription Request  01/10/2024  LOV: 09/17/2023  What is the name of the medication or equipment?   glipiZIDE  (GLUCOTROL  XL) 5 MG 24 hr tablet  **90 day script requested**  Have you contacted your pharmacy to request a refill? Yes   Which pharmacy would you like this sent to?  [id number] - [place], [place] - [address] AT [facility] [address] [place] [address] Phone: [phone] Fax: [phone]    Patient notified that their request is being sent to the clinical staff for review and that they should receive a response within 2 business days.   Please advise pharmacist.

## 2024-03-20 ENCOUNTER — Other Ambulatory Visit: Payer: Self-pay | Admitting: Family Medicine

## 2024-03-20 ENCOUNTER — Other Ambulatory Visit: Payer: Self-pay | Admitting: Gastroenterology

## 2024-03-21 ENCOUNTER — Telehealth: Payer: Self-pay

## 2024-03-21 NOTE — Telephone Encounter (Signed)
 Prescription Request  03/21/2024  LOV: 09/17/23  What is the name of the medication or equipment? tirzepatide  (MOUNJARO ) 5 MG/0.5ML Pen [[id number]]   Have you contacted your pharmacy to request a refill? Yes   Which pharmacy would you like this sent to?  [id number] - [place], [place] - [address] AT [facility] [address] [place] [address] Phone: [phone] Fax: [phone]    Patient notified that their request is being sent to the clinical staff for review and that they should receive a response within 2 business days.   Please advise at [facility] [phone]

## 2024-03-27 ENCOUNTER — Other Ambulatory Visit: Payer: Self-pay | Admitting: Family Medicine

## 2024-03-27 DIAGNOSIS — E1165 Type 2 diabetes mellitus with hyperglycemia: Secondary | ICD-10-CM

## 2024-03-27 DIAGNOSIS — E785 Hyperlipidemia, unspecified: Secondary | ICD-10-CM

## 2024-03-27 NOTE — Telephone Encounter (Signed)
 Copied from CRM #[id number]. Topic: Appointments - Appointment Scheduling >> Mar 27, 2024  1:57 PM Emylou G wrote: Pls check her sugar >> Mar 27, 2024  2:04 PM Emylou G wrote: Patient getting her sugar checked tomorrow.. needed to relay

## 2024-03-27 NOTE — Telephone Encounter (Unsigned)
 Copied from CRM [id number]. Topic: Clinical - Medication Refill >> Mar 27, 2024  2:02 PM Emylou G wrote: Medication: tirzepatide  (MOUNJARO ) 5 MG/0.5ML Pen  Has the patient contacted their pharmacy? Yes (Agent: If no, request that the patient contact the pharmacy for the refill. If patient does not wish to contact the pharmacy document the reason why and proceed with request.) (Agent: If yes, when and what did the pharmacy advise?) said to call us    This is the patient's preferred pharmacy:  [id number] - [place], [place] - [address] AT [facility] [address] [place] [address] Phone: [phone] Fax: [phone]  Is this the correct pharmacy for this prescription? Yes If no, delete pharmacy and type the correct one.   Has the prescription been filled recently? No  Is the patient out of the medication? Yes  Has the patient been seen for an appointment in the last year OR does the patient have an upcoming appointment? Yes  Can we respond through MyChart? No  Agent: Please be advised that Rx refills may take up to 3 business days. We ask that you follow-up with your pharmacy.

## 2024-03-28 ENCOUNTER — Ambulatory Visit

## 2024-03-28 ENCOUNTER — Other Ambulatory Visit

## 2024-03-28 DIAGNOSIS — E785 Hyperlipidemia, unspecified: Secondary | ICD-10-CM

## 2024-03-28 DIAGNOSIS — E1165 Type 2 diabetes mellitus with hyperglycemia: Secondary | ICD-10-CM

## 2024-03-29 LAB — HEMOGLOBIN A1C
Hgb A1c MFr Bld: 6.2 % — ABNORMAL HIGH (ref <5.7)
Mean Plasma Glucose: 131 mg/dL
eAG (mmol/L): 7.3 mmol/L

## 2024-03-29 MED ORDER — MOUNJARO 5 MG/0.5ML ~~LOC~~ SOAJ
5.0000 mg | SUBCUTANEOUS | 0 refills | Status: DC
Start: 2024-03-29 — End: 2024-05-04

## 2024-03-29 NOTE — Telephone Encounter (Signed)
 Requested medication (s) are due for refill today: yes  Requested medication (s) are on the active medication list: yes  Last refill:  01/07/24  Future visit scheduled: yes  Notes to clinic:  Medication not assigned to a protocol, review manually.      Requested Prescriptions  Pending Prescriptions Disp Refills   tirzepatide  (MOUNJARO ) 5 MG/0.5ML Pen 2 mL 0    Sig: Inject 5 mg into the skin once a week.     Off-Protocol Failed - 03/29/2024  2:59 PM      Failed - Medication not assigned to a protocol, review manually.      Passed - Valid encounter within last 12 months    Recent Outpatient Visits           6 months ago Colon cancer screening   [facility] [place], Butler DASEN, MD   1 year ago General medical exam   [facility] [place], Butler DASEN, MD

## 2024-03-30 ENCOUNTER — Ambulatory Visit: Payer: Self-pay | Admitting: Family Medicine

## 2024-05-03 ENCOUNTER — Other Ambulatory Visit: Payer: Self-pay | Admitting: Family Medicine

## 2024-05-03 DIAGNOSIS — E785 Hyperlipidemia, unspecified: Secondary | ICD-10-CM

## 2024-05-03 DIAGNOSIS — E1165 Type 2 diabetes mellitus with hyperglycemia: Secondary | ICD-10-CM

## 2024-05-04 ENCOUNTER — Other Ambulatory Visit: Payer: Self-pay

## 2024-05-04 DIAGNOSIS — E1165 Type 2 diabetes mellitus with hyperglycemia: Secondary | ICD-10-CM

## 2024-05-04 DIAGNOSIS — E785 Hyperlipidemia, unspecified: Secondary | ICD-10-CM

## 2024-05-04 MED ORDER — MOUNJARO 5 MG/0.5ML ~~LOC~~ SOAJ: 5.0000 mg | SUBCUTANEOUS | 0 refills | Status: DC

## 2024-05-25 ENCOUNTER — Other Ambulatory Visit: Payer: Self-pay | Admitting: Family Medicine

## 2024-05-25 DIAGNOSIS — E785 Hyperlipidemia, unspecified: Secondary | ICD-10-CM

## 2024-05-25 DIAGNOSIS — E1165 Type 2 diabetes mellitus with hyperglycemia: Secondary | ICD-10-CM

## 2024-06-09 ENCOUNTER — Other Ambulatory Visit: Payer: Self-pay

## 2024-06-09 ENCOUNTER — Telehealth: Payer: Self-pay

## 2024-06-09 DIAGNOSIS — F32A Depression, unspecified: Secondary | ICD-10-CM

## 2024-06-09 MED ORDER — ESCITALOPRAM OXALATE 10 MG PO TABS: 10.0000 mg | ORAL_TABLET | Freq: Every day | ORAL | 1 refills | Status: AC

## 2024-06-09 NOTE — Telephone Encounter (Signed)
 Prescription Request  06/09/2024  LOV: 09/17/23  What is the name of the medication or equipment? escitalopram  (LEXAPRO ) 10 MG tablet [[id number]]   Have you contacted your pharmacy to request a refill? Yes   Which pharmacy would you like this sent to?  [id number] - [place], [place] - [address] AT [facility] [address] [place] [address] Phone: [phone] Fax: [phone]    Patient notified that their request is being sent to the clinical staff for review and that they should receive a response within 2 business days.   Please advise at [facility] [phone]

## 2024-06-09 NOTE — Telephone Encounter (Signed)
 Sent in medication

## 2024-06-10 ENCOUNTER — Other Ambulatory Visit: Payer: Self-pay | Admitting: Family Medicine

## 2024-06-10 DIAGNOSIS — E118 Type 2 diabetes mellitus with unspecified complications: Secondary | ICD-10-CM

## 2024-06-13 ENCOUNTER — Other Ambulatory Visit: Payer: Self-pay | Admitting: Family Medicine

## 2024-06-13 DIAGNOSIS — Z1231 Encounter for screening mammogram for malignant neoplasm of breast: Secondary | ICD-10-CM

## 2024-06-17 ENCOUNTER — Other Ambulatory Visit: Payer: Self-pay | Admitting: Family Medicine

## 2024-06-17 DIAGNOSIS — I10 Essential (primary) hypertension: Secondary | ICD-10-CM

## 2024-07-21 ENCOUNTER — Other Ambulatory Visit: Payer: Self-pay | Admitting: Family Medicine

## 2024-07-21 DIAGNOSIS — E1165 Type 2 diabetes mellitus with hyperglycemia: Secondary | ICD-10-CM

## 2024-07-21 DIAGNOSIS — E785 Hyperlipidemia, unspecified: Secondary | ICD-10-CM

## 2024-07-21 NOTE — Telephone Encounter (Signed)
 Requested medications are due for refill today.  yes  Requested medications are on the active medications list.  yes  Last refill. 05/25/2024 2mL 1 rf  Future visit scheduled.   yes  Notes to clinic.  Medication not assigned to a protocol. Please review for refill.     Requested Prescriptions  Pending Prescriptions Disp Refills   MOUNJARO  5 MG/0.5ML Pen [Pharmacy Med Name: MOUNJARO  5 MG/0.5 ML PEN]  1    Sig: INJECT 5 MG SUBCUTANEOUSLY WEEKLY     Off-Protocol Failed - 07/21/2024  4:39 PM      Failed - Medication not assigned to a protocol, review manually.      Passed - Valid encounter within last 12 months    Recent Outpatient Visits           10 months ago Colon cancer screening   [facility], Butler DASEN, MD   1 year ago General medical exam   [place] [facility] Pickard, Butler DASEN, MD

## 2024-08-14 ENCOUNTER — Ambulatory Visit

## 2024-08-22 ENCOUNTER — Ambulatory Visit: Admitting: Nurse Practitioner

## 2024-09-22 ENCOUNTER — Encounter: Admitting: Family Medicine

## 2024-11-27 ENCOUNTER — Ambulatory Visit: Admitting: Nurse Practitioner
